# Patient Record
Sex: Female | Born: 1965
Health system: Southern US, Community
[De-identification: ages and names within clinical notes are randomized; demographics above are authoritative.]

## PROBLEM LIST (undated history)

## (undated) DIAGNOSIS — I959 Hypotension, unspecified: Secondary | ICD-10-CM

## (undated) DIAGNOSIS — N920 Excessive and frequent menstruation with regular cycle: Secondary | ICD-10-CM

## (undated) DIAGNOSIS — M797 Fibromyalgia: Secondary | ICD-10-CM

## (undated) DIAGNOSIS — E7800 Pure hypercholesterolemia, unspecified: Secondary | ICD-10-CM

## (undated) DIAGNOSIS — M25512 Pain in left shoulder: Secondary | ICD-10-CM

## (undated) DIAGNOSIS — N83209 Unspecified ovarian cyst, unspecified side: Secondary | ICD-10-CM

## (undated) DIAGNOSIS — K219 Gastro-esophageal reflux disease without esophagitis: Secondary | ICD-10-CM

## (undated) DIAGNOSIS — B001 Herpesviral vesicular dermatitis: Secondary | ICD-10-CM

## (undated) DIAGNOSIS — M19079 Primary osteoarthritis, unspecified ankle and foot: Secondary | ICD-10-CM

## (undated) DIAGNOSIS — M545 Low back pain, unspecified: Secondary | ICD-10-CM

## (undated) DIAGNOSIS — D509 Iron deficiency anemia, unspecified: Secondary | ICD-10-CM

## (undated) HISTORY — PX: WRIST GANGLION EXCISION: SUR520

## (undated) HISTORY — DX: Low back pain, unspecified: M54.50

## (undated) HISTORY — DX: Excessive and frequent menstruation with regular cycle: N92.0

## (undated) HISTORY — DX: Primary osteoarthritis, unspecified ankle and foot: M19.079

## (undated) HISTORY — PX: OTHER SURGICAL HISTORY: SHX169

## (undated) HISTORY — DX: Low back pain: M54.5

## (undated) HISTORY — DX: Pure hypercholesterolemia, unspecified: E78.00

## (undated) HISTORY — PX: APPENDECTOMY: SHX54

## (undated) HISTORY — DX: Iron deficiency anemia, unspecified: D50.9

## (undated) HISTORY — DX: Unspecified ovarian cyst, unspecified side: N83.209

## (undated) HISTORY — DX: Herpesviral vesicular dermatitis: B00.1

## (undated) HISTORY — DX: Hypotension, unspecified: I95.9

## (undated) HISTORY — PX: OVARIAN CYST REMOVAL: SHX89

---

## 2002-08-20 ENCOUNTER — Ambulatory Visit (HOSPITAL_COMMUNITY): Admission: RE | Admit: 2002-08-20 | Discharge: 2002-08-20 | Payer: Self-pay | Admitting: *Deleted

## 2002-08-20 ENCOUNTER — Encounter: Payer: Self-pay | Admitting: *Deleted

## 2002-12-28 ENCOUNTER — Other Ambulatory Visit: Admission: RE | Admit: 2002-12-28 | Discharge: 2002-12-28 | Payer: Self-pay | Admitting: Gynecology

## 2003-09-22 ENCOUNTER — Encounter: Payer: Self-pay | Admitting: Gynecology

## 2003-09-22 ENCOUNTER — Ambulatory Visit (HOSPITAL_COMMUNITY): Admission: RE | Admit: 2003-09-22 | Discharge: 2003-09-22 | Payer: Self-pay | Admitting: Gynecology

## 2005-03-16 ENCOUNTER — Other Ambulatory Visit: Admission: RE | Admit: 2005-03-16 | Discharge: 2005-03-16 | Payer: Self-pay | Admitting: Gynecology

## 2008-01-23 ENCOUNTER — Other Ambulatory Visit: Admission: RE | Admit: 2008-01-23 | Discharge: 2008-01-23 | Payer: Self-pay | Admitting: Gynecology

## 2009-01-25 ENCOUNTER — Ambulatory Visit: Payer: Self-pay | Admitting: Sports Medicine

## 2009-01-25 DIAGNOSIS — M76899 Other specified enthesopathies of unspecified lower limb, excluding foot: Secondary | ICD-10-CM | POA: Insufficient documentation

## 2009-01-25 DIAGNOSIS — IMO0002 Reserved for concepts with insufficient information to code with codable children: Secondary | ICD-10-CM | POA: Insufficient documentation

## 2009-04-06 ENCOUNTER — Encounter: Admission: RE | Admit: 2009-04-06 | Discharge: 2009-04-06 | Payer: Self-pay | Admitting: Family Medicine

## 2009-11-06 ENCOUNTER — Ambulatory Visit (HOSPITAL_COMMUNITY): Admission: RE | Admit: 2009-11-06 | Discharge: 2009-11-06 | Payer: Self-pay | Admitting: Unknown Physician Specialty

## 2010-01-20 ENCOUNTER — Ambulatory Visit: Payer: Self-pay | Admitting: Gynecology

## 2010-01-20 ENCOUNTER — Other Ambulatory Visit: Admission: RE | Admit: 2010-01-20 | Discharge: 2010-01-20 | Payer: Self-pay | Admitting: Gynecology

## 2010-02-06 ENCOUNTER — Ambulatory Visit: Payer: Self-pay | Admitting: Gynecology

## 2010-05-23 ENCOUNTER — Encounter: Admission: RE | Admit: 2010-05-23 | Discharge: 2010-05-23 | Payer: Self-pay | Admitting: Sports Medicine

## 2011-05-01 ENCOUNTER — Telehealth: Payer: Self-pay | Admitting: Pediatrics

## 2011-05-01 NOTE — Telephone Encounter (Signed)
error 

## 2011-08-30 ENCOUNTER — Ambulatory Visit (INDEPENDENT_AMBULATORY_CARE_PROVIDER_SITE_OTHER): Payer: Commercial Managed Care - PPO | Admitting: Pediatrics

## 2011-08-30 DIAGNOSIS — Z23 Encounter for immunization: Secondary | ICD-10-CM

## 2012-04-28 ENCOUNTER — Ambulatory Visit (INDEPENDENT_AMBULATORY_CARE_PROVIDER_SITE_OTHER): Payer: Commercial Managed Care - PPO | Admitting: Family Medicine

## 2012-04-28 ENCOUNTER — Ambulatory Visit: Payer: Commercial Managed Care - PPO

## 2012-04-28 VITALS — BP 107/67 | HR 76 | Temp 98.8°F | Resp 16 | Ht 65.0 in | Wt 165.0 lb

## 2012-04-28 DIAGNOSIS — M7740 Metatarsalgia, unspecified foot: Secondary | ICD-10-CM

## 2012-04-28 DIAGNOSIS — M7660 Achilles tendinitis, unspecified leg: Secondary | ICD-10-CM

## 2012-04-28 DIAGNOSIS — M7662 Achilles tendinitis, left leg: Secondary | ICD-10-CM

## 2012-04-28 DIAGNOSIS — M79609 Pain in unspecified limb: Secondary | ICD-10-CM

## 2012-04-28 DIAGNOSIS — M19079 Primary osteoarthritis, unspecified ankle and foot: Secondary | ICD-10-CM | POA: Insufficient documentation

## 2012-04-28 DIAGNOSIS — M79674 Pain in right toe(s): Secondary | ICD-10-CM

## 2012-04-28 DIAGNOSIS — M7750 Other enthesopathy of unspecified foot: Secondary | ICD-10-CM

## 2012-04-28 DIAGNOSIS — G5760 Lesion of plantar nerve, unspecified lower limb: Secondary | ICD-10-CM

## 2012-04-28 DIAGNOSIS — M7752 Other enthesopathy of left foot: Secondary | ICD-10-CM | POA: Insufficient documentation

## 2012-04-28 NOTE — Progress Notes (Signed)
  Subjective:    Patient ID: Sharon Simpson, female    DOB: 12/02/1966, 46 y.o.   MRN: [id number]  HPI R great toe:  Present for the last year.  Intermittent in nature.  Pain sometimes with weight bearing.  Shooting pain down 1st metarsal.  No known alleviating/aggravating factors.  No swelling.  No trauma.  Pain has acutely spiked over the last 1-2 weeks.  Pain minimally improved with NSAIDs.    L achilles pain: Pt was at championship game.  Son scored goal.  Jumped up an noted severe L achilles pain after words.  Pain worse with plantar flexion.  No swelling.  Point tenderness over insertion point of achilles to calcaneus.  This has never happened before.   Review of Systems See HPI, otherwise ROS negative.     Objective:   Physical Exam Gen: in bed, NAD MSK:  Ankle (L): No visible erythema or swelling. Range of motion is full in all directions. Strength is 5/5 in all directions. Stable lateral and medial ligaments; squeeze test and kleiger test unremarkable; Talar dome nontender; No pain at base of 5th MT; No tenderness over cuboid; No tenderness over N spot or navicular prominence No tenderness on posterior aspects of lateral and medial malleolus No sign of peroneal tendon subluxations; Negative tarsal tunnel tinel's Able to walk 4 steps. +TTP over insertion point of L achilles to calcaneus. + Pain with plantar flexion.   Foot (R): + TTP over ray of 1st MTP (on dorsal aspect0 No obvious swelling or erythema.  ROM intact       Assessment & Plan:

## 2012-04-28 NOTE — Assessment & Plan Note (Addendum)
Most consistent with L achilles enthesiopathy given mechanism of injury and point tenderness. Will place in post op shoe in interim. Discussed RICE treatment. Handout given.

## 2012-04-28 NOTE — Patient Instructions (Signed)
Morton's Neuroma Neuralgia (nerve pain) or neuroma (benign [non-cancerous] nerve tumor) may develop on any interdigital nerve. The interdigital nerves (nerves between digits) of the foot travel beneath and between the metatarsals (long bones of the fore foot) and pass the nerve endings to the toes. The third interdigital is a common place for a small neuroma to form called Morton's neuroma. Another nerve to be affected commonly is the fourth interdigital nerve. This would be in approximately in the area of the base or ball under the bottom of your fourth toe. This condition occurs more commonly in women and is usually on one side. It is usually first noticed by pain radiating (spreading) to the ball of the foot or to the toes. CAUSES The cause of interdigital neuralgia may be from low grade repetitive trauma (damage caused by an accident) as in activities causing a repeated pounding of the foot (running, jumping etc.). It is also caused by improper footwear or recent loss of the fatty padding on the bottom of the foot. TREATMENT  The condition often resolves (goes away) simply with decreasing activity if that is thought to be the cause. Proper shoes are beneficial. Orthotics (special foot support aids) such as a metatarsal bar are often beneficial. This condition usually responds to conservative therapy, however if surgery is necessary it usually brings complete relief. HOME CARE INSTRUCTIONS   Apply ice to the area of soreness for 15 to 20 minutes, 3 to 4 times per day, while awake for the first 2 days. Put ice in a plastic bag and place a towel between the bag of ice and your skin.   Only take over-the-counter or prescription medicines for pain, discomfort, or fever as directed by your caregiver.  MAKE SURE YOU:   Understand these instructions.   Will watch your condition.   Will get help right away if you are not doing well or get worse.  Document Released: 03/04/2001 Document Revised:  11/15/2011 Document Reviewed: 11/26/2005 [facility] Patient Information 2012 [place], [place].  Patient information: Metatarsalgia   (The Basics)View in SpanishWritten by the doctors and editors at UpToDate  What is metatarsalgia? -- Metatarsalgia is a condition that causes pain in the ball of the foot. It happens when there is inflammation in the metatarsals, which are the foot bones closest to the toes (figure 1). Different things can cause metatarsalgia. It can happen in people who run or do other activities that put a lot of pressure on the feet. It can also happen in people who wear tight-fitting shoes a lot or have certain foot problems. What are the symptoms of metatarsalgia? -- Metatarsalgia causes pain in the ball of the foot. The pain can also spread to the toes. The pain is usually worse when people run or do other activities that put pressure on their feet. Will I need tests? -- Probably not. Your doctor or nurse should be able to tell if you have metatarsalgia by learning about your symptoms and doing an exam. He or she might order an X-ray of your foot to make sure your symptoms are not caused by another condition. How is metatarsalgia treated? -- Treatment for metatarsalgia usually involves 1 or more of the following: Resting your foot - Give your foot a chance to heal by resting. But don't completely stop being active. You can do activities that put less pressure on your feet, such as swimming.  Putting ice on your foot when it hurts or after activities that cause pain - You can  put a cold gel pack, bag of ice, or bag of frozen vegetables on the painful area every 1 to 2 hours, for 15 minutes each time. Put a thin towel between the ice (or other cold object) and your skin.  Taking a pain-relieving medicine, such as acetaminophen (sample brand name: Tylenol), ibuprofen (sample brand names: Advil, Motrin), or naproxen (sample brand names: Aleve, Naprosyn).  Wearing sturdy shoes and a  metatarsal insert - Sneakers with a lot of cushion and good arch and heel support are best. Your doctor will also probably recommend that you put a padded insert called a "metatarsal pad" into your shoe.  Wearing arch supports or special shoe inserts called "orthotics" that are made to fit your foot In most cases, these treatments help improve symptoms. But if your symptoms don't improve, your doctor might talk with you about other options. This might involve surgery to correct the position of your foot bones. How long does metatarsalgia take to heal? -- Metatarsalgia can take weeks to months to heal, depending on the cause and your symptoms.   Achilles Tendinitis Tendinitis a swelling and soreness of the tendon. The pain in the tendon (cord-like structure which attaches muscle to bone) is produced by tiny tears and the inflammation present in that tendon. It commonly occurs at the shoulders, heels, and elbows. It is usually caused by overusing the tendon and joint involved. Achilles tendinitis involves the Achilles tendon. This is the large tendon in the back of the leg just above the foot. It attaches the large muscles of the lower leg to the heel bone (called calcaneus).  This diagnosis (learning what is wrong) is made by examination. X-rays will be generally be normal if only tendinitis is present. HOME CARE INSTRUCTIONS   Apply ice to the injury for 15 to 20 minutes, 3 to 4 times per day. Put the ice in a plastic bag and place a towel between the bag of ice and your skin.   Try to avoid use other than gentle range of motion while the tendon is painful. Do not resume use until instructed by your caregiver. Then begin use gradually. Do not increase use to the point of pain. If pain does develop, decrease use and continue the above measures. Gradually increase activities that do not cause discomfort until you gradually achieve normal use.   Only take over-the-counter or prescription medicines for  pain, discomfort, or fever as directed by your caregiver.  SEEK MEDICAL CARE IF:   Your pain and swelling increase or pain is uncontrolled with medications.   You develop new, unexplained problems (symptoms) or an increase of the symptoms that brought you to your caregiver.   You develop an inability to move your toes or foot, develop warmth and swelling in your foot, or begin running an unexplained temperature.  MAKE SURE YOU:   Understand these instructions.   Will watch your condition.   Will get help right away if you are not doing well or get worse.  Document Released: 09/05/2005 Document Revised: 11/15/2011 Document Reviewed: 07/14/2008 [facility] Patient Information 2012 [place], [place].

## 2012-04-28 NOTE — Assessment & Plan Note (Signed)
Ddx includes metatarsalgia, 1st mtp arthritis, and mortons neuroma. . Noted 1st mtp arthritis on foot xray.  Will place in metatarsal pad. NSAIDs. 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 Will defer formal referral because of this.

## 2012-04-30 ENCOUNTER — Ambulatory Visit (INDEPENDENT_AMBULATORY_CARE_PROVIDER_SITE_OTHER): Payer: 59 | Admitting: Sports Medicine

## 2012-04-30 VITALS — BP 100/60 | Ht 65.0 in | Wt 164.0 lb

## 2012-04-30 DIAGNOSIS — M7752 Other enthesopathy of left foot: Secondary | ICD-10-CM

## 2012-04-30 DIAGNOSIS — M79674 Pain in right toe(s): Secondary | ICD-10-CM

## 2012-04-30 DIAGNOSIS — M79609 Pain in unspecified limb: Secondary | ICD-10-CM

## 2012-04-30 DIAGNOSIS — M7750 Other enthesopathy of unspecified foot: Secondary | ICD-10-CM

## 2012-04-30 MED ORDER — MELOXICAM 15 MG PO TABS: ORAL_TABLET | ORAL | Status: AC

## 2012-04-30 NOTE — Progress Notes (Signed)
  Subjective:    Patient ID: Sharon Simpson, female    DOB: 1966-06-10, 46 y.o.   MRN: [id number]  HPI Sharon Simpson is a very pleasant 46 year old female who comes in with 2 complaints.  Right great toe pain: Present for approximately 2 years. Went to urgent care, had a first metatarsal ray posting placed, that helped her pain significantly. She did have x-rays at that point which showed significant first metatarsophalangeal Dorsal spurring.  Left heel pain. Localized over the calcaneal bursa. Was placed in a postop shoe from urgent care, and returns for further evaluation.  Past medical history, surgical history, family history, social history, allergies, and medications reviewed from the medical record and no changes needed. Review of Systems    No fevers, chills, night sweats, weight loss, chest pain, or shortness of breath.  Social History: Non-smoker. Objective:   Physical Exam General:  Well developed, well nourished, and in no acute distress. Neuro:  Alert and oriented x3, extra-ocular muscles intact. Skin: Warm and dry, no rashes noted. Respiratory:  Not using accessory muscles, speaking in full sentences. Musculoskeletal: Right foot: Hallux rigidus present, pain all over the first metatarsophalangeal joint.  Left foot: Tender to palpation over calcaneal bursa, some fullness palpable in this area. Negative Thompson's test, and no nodules palpable in the Achilles, no tenderness over the retrocalcaneal bursa. Negative calcaneal squeeze, no tenderness to palpation at the calcaneal insertion of the plantar fascia.      Assessment & Plan:

## 2012-04-30 NOTE — Assessment & Plan Note (Signed)
Meloxicam. 1st MT ray post. RTC June 18 if no better for injection.

## 2012-04-30 NOTE — Assessment & Plan Note (Signed)
Meloxicam. Heel lift. RTC June 18 if no better to consider injection.

## 2012-05-01 NOTE — Progress Notes (Signed)
  Subjective:    Patient ID: Sharon Simpson, female    DOB: [birth year], 46 y.o.   MRN: [id number]  HPI    Review of Systems     Objective:   Physical Exam        Assessment & Plan:  Patient precepted with Dr. Alvester Morin.  AGree with assessment and plan.

## 2014-02-08 ENCOUNTER — Other Ambulatory Visit (HOSPITAL_COMMUNITY)
Admission: RE | Admit: 2014-02-08 | Discharge: 2014-02-08 | Disposition: A | Discharge status: Home / Self Care | Payer: 59 | Source: Ambulatory Visit | Attending: Gynecology | Admitting: Gynecology

## 2014-02-08 ENCOUNTER — Ambulatory Visit (INDEPENDENT_AMBULATORY_CARE_PROVIDER_SITE_OTHER): Payer: 59 | Admitting: Gynecology

## 2014-02-08 ENCOUNTER — Encounter: Payer: Self-pay | Admitting: Gynecology

## 2014-02-08 VITALS — BP 128/80

## 2014-02-08 DIAGNOSIS — N925 Other specified irregular menstruation: Secondary | ICD-10-CM

## 2014-02-08 DIAGNOSIS — Z1151 Encounter for screening for human papillomavirus (HPV): Secondary | ICD-10-CM | POA: Insufficient documentation

## 2014-02-08 DIAGNOSIS — R635 Abnormal weight gain: Secondary | ICD-10-CM

## 2014-02-08 DIAGNOSIS — Z01419 Encounter for gynecological examination (general) (routine) without abnormal findings: Secondary | ICD-10-CM | POA: Insufficient documentation

## 2014-02-08 DIAGNOSIS — R42 Dizziness and giddiness: Secondary | ICD-10-CM

## 2014-02-08 DIAGNOSIS — Z124 Encounter for screening for malignant neoplasm of cervix: Secondary | ICD-10-CM

## 2014-02-08 DIAGNOSIS — N938 Other specified abnormal uterine and vaginal bleeding: Secondary | ICD-10-CM

## 2014-02-08 DIAGNOSIS — N949 Unspecified condition associated with female genital organs and menstrual cycle: Secondary | ICD-10-CM

## 2014-02-08 LAB — CBC WITH DIFFERENTIAL/PLATELET
BASOS ABS: 0.1 10*3/uL (ref 0.0–0.1)
Basophils Relative: 1 % (ref 0–1)
Eosinophils Absolute: 0.1 10*3/uL (ref 0.0–0.7)
Eosinophils Relative: 2 % (ref 0–5)
HCT: 31.2 % — ABNORMAL LOW (ref 36.0–46.0)
Hemoglobin: 10 g/dL — ABNORMAL LOW (ref 12.0–15.0)
LYMPHS ABS: 1.7 10*3/uL (ref 0.7–4.0)
LYMPHS PCT: 27 % (ref 12–46)
MCH: 24.9 pg — ABNORMAL LOW (ref 26.0–34.0)
MCHC: 32.1 g/dL (ref 30.0–36.0)
MCV: 77.8 fL — ABNORMAL LOW (ref 78.0–100.0)
Monocytes Absolute: 0.4 10*3/uL (ref 0.1–1.0)
Monocytes Relative: 7 % (ref 3–12)
NEUTROS ABS: 4 10*3/uL (ref 1.7–7.7)
Neutrophils Relative %: 63 % (ref 43–77)
PLATELETS: 344 10*3/uL (ref 150–400)
RBC: 4.01 MIL/uL (ref 3.87–5.11)
RDW: 15.1 % (ref 11.5–15.5)
WBC: 6.4 10*3/uL (ref 4.0–10.5)

## 2014-02-08 LAB — GLUCOSE, RANDOM: GLUCOSE: 93 mg/dL (ref 70–99)

## 2014-02-08 LAB — TSH: TSH: 4.35 u[IU]/mL (ref 0.350–4.500)

## 2014-02-08 MED ORDER — MEGESTROL ACETATE 40 MG PO TABS: 40.0000 mg | ORAL_TABLET | Freq: Two times a day (BID) | ORAL | Status: DC

## 2014-02-08 NOTE — Progress Notes (Signed)
   48 year old who presents to the office today stating she has had continuous vaginal bleeding for the past 2 weeks. Patient has not been seen in the office is 2011. Her husband has had a vasectomy. She is also stated the past few days at times that she had felt lightheadedness. She had a menstrual period January 18 and lasted for 5 days She started her next cycle February 18 and has been bleeding since then. Patient also has complained of some weight gain.  Exam: Pelvic exam: Bartholin urethra Skene glands within normal limits blood was noted on the patient's medial thighs Vagina: Blood was present in the vaginal vault Cervix: Blood was present on the external cervical loss Uterus: Upper limits of normal retroverted nontender Adnexa: No palpable mass or tenderness Rectal exam: Not done  A Pap smear was done today. Patient was counseled for an endometrial biopsy. The cervix was cleansed with Betadine solution and a sterile Pipelle was introduced into the uterine cavity and tissue was obtained was submitted for histological evaluation.  Assessment/plan: Patient perimenopausal with dysfunctional uterine bleeding moderate amount of tissue was obtained at time of biopsy submitted for histological evaluation. We will have the following labs ordered today: TSH, prolactin, FSH, and CBC. Her Pap smear was done today as well. Patient will be started on Megace 40 mg 1 by mouth twice a day for 10 days to stop her bleeding. She will be scheduled for sonohysterogram next week to rule out any intrauterine cavity defects such as polyps or myomas.

## 2014-02-08 NOTE — Patient Instructions (Addendum)
Transvaginal Ultrasound Transvaginal ultrasound is a pelvic ultrasound, using a metal probe that is placed in the vagina, to look at a women's female organs. Transvaginal ultrasound is a method of seeing inside the pelvis of a woman. The ultrasound machine sends out sound waves from the transducer (probe). These sound waves bounce off body structures (like an echo) to create a picture. The picture shows up on a monitor. It is called transvaginal because the probe is inserted into the vagina. There should be very little discomfort from the vaginal probe. This test can also be used during pregnancy. Endovaginal ultrasound is another name for a transvaginal ultrasound. In a transabdominal ultrasound, the probe is placed on the outside of the belly. This method gives pictures that are lower quality than pictures from the transvaginal technique. Transvaginal ultrasound is used to look for problems of the female genital tract. Some such problems include:  Infertility problems.  Congenital (birth defect) malformations of the uterus and ovaries.  Tumors in the uterus.  Abnormal bleeding.  Ovarian tumors and cysts.  Abscess (inflamed tissue around pus) in the pelvis.  Unexplained abdominal or pelvic pain.  Pelvic infection. DURING PREGNANCY, TRANSVAGINAL ULTRASOUND MAY BE USED TO LOOK AT:  Normal pregnancy.  Ectopic pregnancy (pregnancy outside the uterus).  Fetal heartbeat.  Abnormalities in the pelvis, that are not seen well with transabdominal ultrasound.  Suspected twins or multiples.  Impending miscarriage.  Problems with the cervix (incompetent cervix, not able to stay closed and hold the baby).  When doing an amniocentesis (removing fluid from the pregnancy sac, for testing).  Looking for abnormalities of the baby.  Checking the growth, development, and age of the fetus.  Measuring the amount of fluid in the amniotic sac.  When doing an external version of the baby (moving  baby into correct position).  Evaluating the baby for problems in high risk pregnancies (biophysical profile).  Suspected fetal demise (death). Sometimes a special ultrasound method called Saline Infusion Sonography (SIS) is used for a more accurate look at the uterus. Sterile saline (salt water) is injected into the uterus of non-pregnant patients to see the inside of the uterus better. SIS is not used on pregnant women. The vaginal probe can also assist in obtaining biopsies of abnormal areas, in draining fluid from cysts on the ovary, and in finding IUDs (intrauterine device, birth control) that cannot be located. PREPARATION FOR TEST A transvaginal ultrasound is done with the bladder empty. The transabdominal ultrasound is done with your bladder full. You may be asked to drink several glasses of water before that exam. Sometimes, a transabdominal ultrasound is done just after a transvaginal ultrasound, to look at organs in your abdomen. PROCEDURE  You will lie down on a table, with your knees bent and your feet in foot holders. The probe is covered with a condom. A sterile lubricant is put into the vagina and on the probe. The lubricant helps transmit the sound waves and avoid irritating the vagina. Your caregiver will move the probe inside the vaginal cavity to scan the pelvic structures. A normal test will show a normal pelvis and normal contents. An abnormal test will show abnormalities of the pelvis, placenta, or baby. ABNORMAL RESULTS MAY BE DUE TO:  Growths or tumors in the:  Uterus.  Ovaries.  Vagina.  Other pelvic structures.  Non-cancerous growths of the uterus and ovaries.  Twisting of the ovary, cutting off blood supply to the ovary (ovarian torsion).  Areas of infection, including:  Pelvic  inflammatory disease.  Abscess in the pelvis.  Locating an IUD. PROBLEMS FOUND IN PREGNANT WOMEN MAY INCLUDE:  Ectopic pregnancy (pregnancy outside the uterus).  Multiple  pregnancies.  Early dilation (opening) of the cervix. This may indicate an incompetent cervix and early delivery.  Impending miscarriage.  Fetal death.  Problems with the placenta, including:  Placenta has grown over the opening of the womb (placenta previa).  Placenta has separated early in the womb (placental abruption).  Placenta grows into the muscle of the uterus (placenta accreta).  Tumors of pregnancy, including gestational trophoblastic disease. This is an abnormal pregnancy, with no fetus. The uterus is filled with many grape-like cysts that could sometimes be cancerous.  Incorrect position of the fetus (breech, vertex).  Intrauterine fetal growth retardation (IUGR) (poor growth in the womb).  Fetal abnormalities or infection. RISKS AND COMPLICATIONS There are no known risks to the ultrasound procedure. There is no X-ray used when doing an ultrasound. Document Released: 11/07/2004 Document Revised: 02/18/2012 Document Reviewed: 10/26/2009 [facility] Patient Information 2014 [place], [place]. Endometrial Biopsy Endometrial biopsy is a procedure in which a tissue sample is taken from inside the uterus. The tissue sample is then looked at under a microscope to see if the tissue is normal or abnormal. The endometrium is the lining of the uterus. This procedure helps determine where you are in your menstrual cycle and how hormone levels are affecting the lining of the uterus. This procedure may also be used to evaluate uterine bleeding or to diagnose endometrial cancer, tuberculosis, polyps, or inflammatory conditions.  LET [facility] CARE PROVIDER KNOW ABOUT: Any allergies you have. All medicines you are taking, including vitamins, herbs, eye drops, creams, and over-the-counter medicines. Previous problems you or members of your family have had with the use of anesthetics. Any blood disorders you have. Previous surgeries you have had. Medical conditions you have. Possibility of  pregnancy. RISKS AND COMPLICATIONS Generally, this is a safe procedure. However, as with any procedure, complications can occur. Possible complications include: Bleeding. Pelvic infection. Puncture of the uterine wall with the biopsy device (rare). BEFORE THE PROCEDURE  Keep a record of your menstrual cycles as directed by your health care provider. You may need to schedule your procedure for a specific time in your cycle. You may want to bring a sanitary pad to wear home after the procedure. Arrange for someone to drive you home after the procedure if you will be given a medicine to help you relax (sedative). PROCEDURE  You may be given a sedative to relax you. You will lie on an exam table with your feet and legs supported as in a pelvic exam. Your health care provider will insert an instrument (speculum) into your vagina to see your cervix. Your cervix will be cleansed with an antiseptic solution. A medicine (local anesthetic) will be used to numb the cervix. A forceps instrument (tenaculum) will be used to hold your cervix steady for the biopsy. A thin, rodlike instrument (uterine sound) will be inserted through your cervix to determine the length of your uterus and the location where the biopsy sample will be removed. A thin, flexible tube (catheter) will be inserted through your cervix and into the uterus. The catheter is used to collect the biopsy sample from your endometrial tissue. The catheter and speculum will then be removed, and the tissue sample will be sent to a lab for examination. AFTER THE PROCEDURE You will rest in a recovery area until you are ready to go home.  You may have mild cramping and a small amount of vaginal bleeding for a few days after the procedure. This is normal. Make sure you find out how to get your test results. Document Released: 03/29/2005 Document Revised: 07/29/2013 Document Reviewed: 05/13/2013 [facility] Patient Information 2014 [place], [place].

## 2014-02-09 LAB — FOLLICLE STIMULATING HORMONE: FSH: 14.9 m[IU]/mL

## 2014-02-09 LAB — PROLACTIN: PROLACTIN: 5.7 ng/mL

## 2014-02-15 ENCOUNTER — Other Ambulatory Visit: Payer: Self-pay | Admitting: Gynecology

## 2014-02-15 DIAGNOSIS — N938 Other specified abnormal uterine and vaginal bleeding: Secondary | ICD-10-CM

## 2014-02-15 DIAGNOSIS — N924 Excessive bleeding in the premenopausal period: Secondary | ICD-10-CM

## 2014-02-15 DIAGNOSIS — N920 Excessive and frequent menstruation with regular cycle: Secondary | ICD-10-CM

## 2014-02-19 ENCOUNTER — Ambulatory Visit: Payer: 59 | Admitting: Gynecology

## 2014-02-19 ENCOUNTER — Other Ambulatory Visit: Payer: 59

## 2014-02-19 ENCOUNTER — Ambulatory Visit (INDEPENDENT_AMBULATORY_CARE_PROVIDER_SITE_OTHER): Payer: 59

## 2014-02-19 ENCOUNTER — Ambulatory Visit (INDEPENDENT_AMBULATORY_CARE_PROVIDER_SITE_OTHER): Payer: 59 | Admitting: Gynecology

## 2014-02-19 ENCOUNTER — Other Ambulatory Visit: Payer: Self-pay | Admitting: Gynecology

## 2014-02-19 DIAGNOSIS — N924 Excessive bleeding in the premenopausal period: Secondary | ICD-10-CM

## 2014-02-19 DIAGNOSIS — D251 Intramural leiomyoma of uterus: Secondary | ICD-10-CM

## 2014-02-19 DIAGNOSIS — D649 Anemia, unspecified: Secondary | ICD-10-CM

## 2014-02-19 DIAGNOSIS — N938 Other specified abnormal uterine and vaginal bleeding: Secondary | ICD-10-CM

## 2014-02-19 DIAGNOSIS — N83209 Unspecified ovarian cyst, unspecified side: Secondary | ICD-10-CM

## 2014-02-19 DIAGNOSIS — N840 Polyp of corpus uteri: Secondary | ICD-10-CM

## 2014-02-19 DIAGNOSIS — N949 Unspecified condition associated with female genital organs and menstrual cycle: Secondary | ICD-10-CM

## 2014-02-19 DIAGNOSIS — N920 Excessive and frequent menstruation with regular cycle: Secondary | ICD-10-CM

## 2014-02-19 DIAGNOSIS — D252 Subserosal leiomyoma of uterus: Secondary | ICD-10-CM

## 2014-02-19 DIAGNOSIS — D259 Leiomyoma of uterus, unspecified: Secondary | ICD-10-CM

## 2014-02-19 DIAGNOSIS — N852 Hypertrophy of uterus: Secondary | ICD-10-CM

## 2014-02-19 DIAGNOSIS — N925 Other specified irregular menstruation: Secondary | ICD-10-CM

## 2014-02-19 NOTE — Progress Notes (Signed)
   The patient presented to the office today for her ongoing evaluation for dysfunctional uterine bleeding. Patient was seen in the office on 02/08/2014 please see note for detail. At that office visit patient had an endometrial biopsy with the following results:  Endometrium, biopsy, uterus - PROLIFERATIVE-TYPE ENDOMETRIUM WITH BREAKDOWN  The patient's lab work indicated that her hemoglobin was 10.0 with a platelet count 31.2 and platelet count 344,000 A. she was started on iron supplementation 1 by mouth twice a day. She was also started on Megace 40 mg twice a day for 10 days to stop her bleeding. Her FSH, prolactin, TSH and blood sugar were all normal as was her Pap smear.  Patient's here today for a sonohysterogram: Uterus measuring 9.8 x 0.0 x 5.6 cm with endometrial stripe of 20.7 mm. Patient had a subserous myoma measuring 7.0 x 5.1 x 6.5 cm with a feeder vessel seen from the uterus. Right ovary thinwall ankle free vascular cyst measuring 31 x 22 x 24 mm. Lipitor was normal. No fluid in the cul-de-sac. After the cervix was cleansed with Betadine solution a sterile intrauterine catheter was introduced into the uterine cavity whereby normal saline was instilled and several endometrial polyps were noted the largest one measuring 23 x 20 x 18 mm.  Assessment/plan: #1 dysfunctional uterine bleeding #2 endometrial polyps #3 small right ovarian cyst #4 anemia  Patient will be scheduled for outpatient resectoscopic polypectomy. She will see me in the office the week before surgery. Literature information was provided. We'll monitor her fibroid in small cyst in the near future. Patient will continue on her iron tablet one twice a day.

## 2014-02-23 ENCOUNTER — Telehealth: Payer: Self-pay

## 2014-02-23 NOTE — Telephone Encounter (Signed)
I contacted patient with surgery date 03/19/14 7:30am and informed her. She confirmed this date will be fine with her. We scheduled her pre op consult for 03/09/14.

## 2014-03-04 ENCOUNTER — Encounter (HOSPITAL_COMMUNITY): Payer: Self-pay | Admitting: *Deleted

## 2014-03-09 ENCOUNTER — Ambulatory Visit: Payer: 59 | Admitting: Gynecology

## 2014-03-17 ENCOUNTER — Encounter: Payer: Self-pay | Admitting: Gynecology

## 2014-03-17 ENCOUNTER — Ambulatory Visit (INDEPENDENT_AMBULATORY_CARE_PROVIDER_SITE_OTHER): Payer: 59 | Admitting: Gynecology

## 2014-03-17 VITALS — BP 124/76

## 2014-03-17 DIAGNOSIS — Z01818 Encounter for other preprocedural examination: Secondary | ICD-10-CM

## 2014-03-17 DIAGNOSIS — N840 Polyp of corpus uteri: Secondary | ICD-10-CM

## 2014-03-17 DIAGNOSIS — N949 Unspecified condition associated with female genital organs and menstrual cycle: Secondary | ICD-10-CM

## 2014-03-17 DIAGNOSIS — N938 Other specified abnormal uterine and vaginal bleeding: Secondary | ICD-10-CM

## 2014-03-17 MED ORDER — METOCLOPRAMIDE HCL 10 MG PO TABS: 10.0000 mg | ORAL_TABLET | Freq: Three times a day (TID) | ORAL | Status: DC

## 2014-03-17 NOTE — Progress Notes (Signed)
Sharon Simpson is an 48 y.o. female. For preop exam. Patient scheduled for resectoscopic polypectomy at the end of this week as a result of her dysfunctional uterine bleeding and endometrial polyps. Her workup has consisted of the following:  Endometrium, biopsy, uterus  - PROLIFERATIVE-TYPE ENDOMETRIUM WITH BREAKDOWN  The patient's lab work indicated that her hemoglobin was 10.0 with a hemoglobin of 31.2 and platelet count 344,000 . She was started on iron supplementation 1 by mouth twice a day. She was also started on Megace 40 mg twice a day for 10 days to stop her bleeding. Her FSH, prolactin, TSH and blood sugar were all normal as was her Pap smear.   sonohysterogram:  Uterus measuring 9.8 x 0.0 x 5.6 cm with endometrial stripe of 20.7 mm. Patient had a subserous myoma measuring 7.0 x 5.1 x 6.5 cm with a feeder vessel seen from the uterus. Right ovary thinwall avascular cyst measuring 31 x 22 x 24 mm. left ovary was normal. No fluid in the cul-de-sac. After the cervix was cleansed with Betadine solution a sterile intrauterine catheter was introduced into the uterine cavity whereby normal saline was instilled and several endometrial polyps were noted the largest one measuring 23 x 20 x 18 mm.  Pertinent Gynecological History: Menses: intermenstrual bleeding Bleeding: intermenstrual bleeding Contraception: vasectomy DES exposure: denies Blood transfusions: none Sexually transmitted diseases: none Previous GYN Procedures: laparotomy/right ovarian cystectomy/appendectomy/3 NSVD  Last mammogram: normal Date: 2011 Last pap: normal Date: 2015 OB History: G3  P3  Menstrual History: Menarche age: 45 No LMP recorded.    No past medical history on file.  Past Surgical History  Procedure Laterality Date  . Appendectomy      48 YR OLD  . Wrist ganglion excision      No family history on file.  Social History:  reports that she has quit smoking. She does not have any  smokeless tobacco history on file. She reports that she does not drink alcohol. Her drug history is not on file.  Allergies:  Allergies  Allergen Reactions  . Codeine   . Latex   . Tetracyclines & Related      (Not in a hospital admission)  REVIEW OF SYSTEMS: A ROS was performed and pertinent positives and negatives are included in the history.  GENERAL: No fevers or chills. HEENT: No change in vision, no earache, sore throat or sinus congestion. NECK: No pain or stiffness. CARDIOVASCULAR: No chest pain or pressure. No palpitations. PULMONARY: No shortness of breath, cough or wheeze. GASTROINTESTINAL: No abdominal pain, nausea, vomiting or diarrhea, melena or bright red blood per rectum. GENITOURINARY: No urinary frequency, urgency, hesitancy or dysuria. MUSCULOSKELETAL: No joint or muscle pain, no back pain, no recent trauma. DERMATOLOGIC: No rash, no itching, no lesions. ENDOCRINE: No polyuria, polydipsia, no heat or cold intolerance. No recent change in weight. HEMATOLOGICAL: No anemia or easy bruising or bleeding. NEUROLOGIC: No headache, seizures, numbness, tingling or weakness. PSYCHIATRIC: No depression, no loss of interest in normal activity or change in sleep pattern.     Blood pressure 124/76.  Physical Exam:  HEENT:unremarkable Neck:Supple, midline, no thyroid megaly, no carotid bruits Lungs:  Clear to auscultation no rhonchi's or wheezes Heart:Regular rate and rhythm, no murmurs or gallops Breast Exam: Not done Abdomen: Soft nontender no rebound or guarding Pelvic:BUS within normal limits Vagina: Some dark menstrual blood present Cervix: No lesions or discharge Uterus: Slightly retroverted Adnexa: No palpable masses or tenderness Extremities: No cords, no  edema Rectal: Not done  Assessment/plan:  #1 dysfunctional uterine bleeding  #2 endometrial polyps  #3 small right ovarian cyst  #4 anemia Patient scheduled for resectoscope polypectomy at the end of this week  the following risks were discussed:                        Patient was counseled as to the risk of surgery to include the following:  1. Infection (prohylactic antibiotics will be administered)  2. DVT/Pulmonary Embolism (prophylactic pneumo compression stockings will be used)  3.Trauma to internal organs requiring additional surgical procedure to repair any injury to     Internal organs requiring perhaps additional hospitalization days.  4.Hemmorhage requiring transfusion and blood products which carry risks such as   anaphylactic reaction, hepatitis and AIDS  Patient had received literature information on the procedure scheduled and all her questions were answered and fully accepts all risk.   Starlyn Skeans FernandezMD8:43 AMTD@Note : This dictation was prepared with  Dragon/digital dictation along withSmart phrase technology. Any transcriptional errors that result from this process are unintentional.      Terrance Mass 03/17/2014, 8:23 AM  Note: This dictation was prepared with  Dragon/digital dictation along withSmart phrase technology. Any transcriptional errors that result from this process are unintentional.

## 2014-03-17 NOTE — Patient Instructions (Signed)
Hysteroscopy °Hysteroscopy is a procedure used for looking inside the womb (uterus). It may be done for various reasons, including: °· To evaluate abnormal bleeding, fibroid (benign, noncancerous) tumors, polyps, scar tissue (adhesions), and possibly cancer of the uterus. °· To look for lumps (tumors) and other uterine growths. °· To look for causes of why a woman cannot get pregnant (infertility), causes of recurrent loss of pregnancy (miscarriages), or a lost intrauterine device (IUD). °· To perform a sterilization by blocking the fallopian tubes from inside the uterus. °In this procedure, a thin, flexible tube with a tiny light and camera on the end of it (hysteroscope) is used to look inside the uterus. A hysteroscopy should be done right after a menstrual period to be sure you are not pregnant. °LET YOUR HEALTH CARE PROVIDER KNOW ABOUT:  °· Any allergies you have. °· All medicines you are taking, including vitamins, herbs, eye drops, creams, and over-the-counter medicines. °· Previous problems you or members of your family have had with the use of anesthetics. °· Any blood disorders you have. °· Previous surgeries you have had. °· Medical conditions you have. °RISKS AND COMPLICATIONS  °Generally, this is a safe procedure. However, as with any procedure, complications can occur. Possible complications include: °· Putting a hole in the uterus. °· Excessive bleeding. °· Infection. °· Damage to the cervix. °· Injury to other organs. °· Allergic reaction to medicines. °· Too much fluid used in the uterus for the procedure. °BEFORE THE PROCEDURE  °· Ask your health care provider about changing or stopping any regular medicines. °· Do not take aspirin or blood thinners for 1 week before the procedure, or as directed by your health care provider. These can cause bleeding. °· If you smoke, do not smoke for 2 weeks before the procedure. °· In some cases, a medicine is placed in the cervix the day before the procedure.  This medicine makes the cervix have a larger opening (dilate). This makes it easier for the instrument to be inserted into the uterus during the procedure. °· Do not eat or drink anything for at least 8 hours before the surgery. °· Arrange for someone to take you home after the procedure. °PROCEDURE  °· You may be given a medicine to relax you (sedative). You may also be given one of the following: °· A medicine that numbs the area around the cervix (local anesthetic). °· A medicine that makes you sleep through the procedure (general anesthetic). °· The hysteroscope is inserted through the vagina into the uterus. The camera on the hysteroscope sends a picture to a TV screen. This gives the surgeon a good view inside the uterus. °· During the procedure, air or a liquid is put into the uterus, which allows the surgeon to see better. °· Sometimes, tissue is gently scraped from inside the uterus. These tissue samples are sent to a lab for testing. °AFTER THE PROCEDURE  °· If you had a general anesthetic, you may be groggy for a couple hours after the procedure. °· If you had a local anesthetic, you will be able to go home as soon as you are stable and feel ready. °· You may have some cramping. This normally lasts for a couple days. °· You may have bleeding, which varies from light spotting for a few days to menstrual-like bleeding for 3 7 days. This is normal. °· If your test results are not back during the visit, make an appointment with your health care provider to find out   the results. °Document Released: 03/04/2001 Document Revised: 09/16/2013 Document Reviewed: 06/25/2013 °[employer]® Patient Information ©2014 [employer]. ° °

## 2014-03-18 MED ORDER — DEXTROSE 5 % IV SOLN
2.0000 g | INTRAVENOUS | Status: AC
  Administered 2014-03-19: 2 g via INTRAVENOUS
  Filled 2014-03-18: qty 2

## 2014-03-19 ENCOUNTER — Encounter (HOSPITAL_COMMUNITY): Payer: 59 | Admitting: Anesthesiology

## 2014-03-19 ENCOUNTER — Ambulatory Visit (HOSPITAL_COMMUNITY)
Admission: RE | Admit: 2014-03-19 | Discharge: 2014-03-19 | Disposition: A | Discharge status: Home / Self Care | Payer: 59 | Source: Ambulatory Visit | Attending: Gynecology | Admitting: Gynecology

## 2014-03-19 ENCOUNTER — Encounter (HOSPITAL_COMMUNITY)
Admission: RE | Disposition: A | Discharge status: Home / Self Care | Payer: Self-pay | Source: Ambulatory Visit | Attending: Gynecology

## 2014-03-19 ENCOUNTER — Encounter (HOSPITAL_COMMUNITY): Payer: Self-pay | Admitting: Pharmacy Technician

## 2014-03-19 ENCOUNTER — Encounter (HOSPITAL_COMMUNITY): Payer: Self-pay | Admitting: *Deleted

## 2014-03-19 ENCOUNTER — Telehealth: Payer: Self-pay

## 2014-03-19 ENCOUNTER — Ambulatory Visit (HOSPITAL_COMMUNITY): Payer: 59 | Admitting: Anesthesiology

## 2014-03-19 DIAGNOSIS — N840 Polyp of corpus uteri: Secondary | ICD-10-CM | POA: Insufficient documentation

## 2014-03-19 DIAGNOSIS — D649 Anemia, unspecified: Secondary | ICD-10-CM

## 2014-03-19 DIAGNOSIS — Z9889 Other specified postprocedural states: Secondary | ICD-10-CM

## 2014-03-19 DIAGNOSIS — Z87891 Personal history of nicotine dependence: Secondary | ICD-10-CM | POA: Insufficient documentation

## 2014-03-19 DIAGNOSIS — Z885 Allergy status to narcotic agent status: Secondary | ICD-10-CM | POA: Insufficient documentation

## 2014-03-19 DIAGNOSIS — N949 Unspecified condition associated with female genital organs and menstrual cycle: Secondary | ICD-10-CM | POA: Insufficient documentation

## 2014-03-19 DIAGNOSIS — N83209 Unspecified ovarian cyst, unspecified side: Secondary | ICD-10-CM | POA: Insufficient documentation

## 2014-03-19 DIAGNOSIS — D252 Subserosal leiomyoma of uterus: Secondary | ICD-10-CM | POA: Insufficient documentation

## 2014-03-19 DIAGNOSIS — N938 Other specified abnormal uterine and vaginal bleeding: Secondary | ICD-10-CM | POA: Insufficient documentation

## 2014-03-19 DIAGNOSIS — N920 Excessive and frequent menstruation with regular cycle: Secondary | ICD-10-CM

## 2014-03-19 DIAGNOSIS — Z91040 Latex allergy status: Secondary | ICD-10-CM | POA: Insufficient documentation

## 2014-03-19 HISTORY — PX: DILATATION & CURETTAGE/HYSTEROSCOPY WITH TRUECLEAR: SHX6353

## 2014-03-19 LAB — CBC
HEMATOCRIT: 37.6 % (ref 36.0–46.0)
Hemoglobin: 11.8 g/dL — ABNORMAL LOW (ref 12.0–15.0)
MCH: 25.1 pg — ABNORMAL LOW (ref 26.0–34.0)
MCHC: 31.4 g/dL (ref 30.0–36.0)
MCV: 80 fL (ref 78.0–100.0)
Platelets: 314 10*3/uL (ref 150–400)
RBC: 4.7 MIL/uL (ref 3.87–5.11)
RDW: 15 % (ref 11.5–15.5)
WBC: 9.2 10*3/uL (ref 4.0–10.5)

## 2014-03-19 LAB — URINALYSIS, ROUTINE W REFLEX MICROSCOPIC
BILIRUBIN URINE: NEGATIVE
Glucose, UA: NEGATIVE mg/dL
Ketones, ur: NEGATIVE mg/dL
NITRITE: NEGATIVE
PROTEIN: NEGATIVE mg/dL
SPECIFIC GRAVITY, URINE: 1.025 (ref 1.005–1.030)
Urobilinogen, UA: 0.2 mg/dL (ref 0.0–1.0)
pH: 5.5 (ref 5.0–8.0)

## 2014-03-19 LAB — URINE MICROSCOPIC-ADD ON

## 2014-03-19 LAB — PREGNANCY, URINE: Preg Test, Ur: NEGATIVE

## 2014-03-19 SURGERY — DILATATION & CURETTAGE/HYSTEROSCOPY WITH TRUCLEAR
Anesthesia: General | Site: Uterus

## 2014-03-19 MED ORDER — LIDOCAINE HCL (CARDIAC) 20 MG/ML IV SOLN
INTRAVENOUS | Status: DC | PRN
  Administered 2014-03-19: 50 mg via INTRAVENOUS

## 2014-03-19 MED ORDER — FENTANYL CITRATE 0.05 MG/ML IJ SOLN
INTRAMUSCULAR | Status: DC | PRN
  Administered 2014-03-19: 50 ug via INTRAVENOUS
  Administered 2014-03-19 (×2): 25 ug via INTRAVENOUS

## 2014-03-19 MED ORDER — KETOROLAC TROMETHAMINE 30 MG/ML IJ SOLN
INTRAMUSCULAR | Status: AC
  Filled 2014-03-19: qty 1

## 2014-03-19 MED ORDER — SODIUM CHLORIDE 0.9 % IR SOLN
Status: DC | PRN
  Administered 2014-03-19 (×2): 3000 mL

## 2014-03-19 MED ORDER — FENTANYL CITRATE 0.05 MG/ML IJ SOLN
INTRAMUSCULAR | Status: AC
  Filled 2014-03-19: qty 2

## 2014-03-19 MED ORDER — ONDANSETRON HCL 4 MG/2ML IJ SOLN
INTRAMUSCULAR | Status: AC
  Filled 2014-03-19: qty 2

## 2014-03-19 MED ORDER — DEXAMETHASONE SODIUM PHOSPHATE 10 MG/ML IJ SOLN
INTRAMUSCULAR | Status: DC | PRN
  Administered 2014-03-19: 10 mg via INTRAVENOUS

## 2014-03-19 MED ORDER — PROPOFOL 10 MG/ML IV EMUL
INTRAVENOUS | Status: AC
  Filled 2014-03-19: qty 20

## 2014-03-19 MED ORDER — MIDAZOLAM HCL 2 MG/2ML IJ SOLN
INTRAMUSCULAR | Status: AC
  Filled 2014-03-19: qty 2

## 2014-03-19 MED ORDER — LACTATED RINGERS IV SOLN
INTRAVENOUS | Status: DC
Start: 2014-03-19 — End: 2014-03-19
  Administered 2014-03-19 (×2): via INTRAVENOUS

## 2014-03-19 MED ORDER — SODIUM CHLORIDE 0.9 % IJ SOLN
INTRAMUSCULAR | Status: AC
  Filled 2014-03-19: qty 50

## 2014-03-19 MED ORDER — KETOROLAC TROMETHAMINE 30 MG/ML IJ SOLN
INTRAMUSCULAR | Status: DC | PRN
  Administered 2014-03-19: 30 mg via INTRAVENOUS

## 2014-03-19 MED ORDER — GLYCINE 1.5 % IR SOLN: Status: DC | PRN

## 2014-03-19 MED ORDER — DEXAMETHASONE SODIUM PHOSPHATE 10 MG/ML IJ SOLN
INTRAMUSCULAR | Status: AC
  Filled 2014-03-19: qty 1

## 2014-03-19 MED ORDER — MIDAZOLAM HCL 5 MG/5ML IJ SOLN
INTRAMUSCULAR | Status: DC | PRN
  Administered 2014-03-19: 2 mg via INTRAVENOUS

## 2014-03-19 MED ORDER — MEPERIDINE HCL 25 MG/ML IJ SOLN: 6.2500 mg | INTRAMUSCULAR | Status: DC | PRN

## 2014-03-19 MED ORDER — KETOROLAC TROMETHAMINE 30 MG/ML IJ SOLN: 15.0000 mg | Freq: Once | INTRAMUSCULAR | Status: DC | PRN

## 2014-03-19 MED ORDER — ONDANSETRON HCL 4 MG/2ML IJ SOLN: 4.0000 mg | Freq: Once | INTRAMUSCULAR | Status: DC | PRN

## 2014-03-19 MED ORDER — FENTANYL CITRATE 0.05 MG/ML IJ SOLN
25.0000 ug | INTRAMUSCULAR | Status: DC | PRN
  Administered 2014-03-19 (×3): 25 ug via INTRAVENOUS

## 2014-03-19 MED ORDER — ONDANSETRON HCL 4 MG/2ML IJ SOLN
INTRAMUSCULAR | Status: DC | PRN
  Administered 2014-03-19: 4 mg via INTRAVENOUS

## 2014-03-19 MED ORDER — SODIUM CHLORIDE 0.9 % IJ SOLN: INTRAMUSCULAR | Status: DC | PRN

## 2014-03-19 MED ORDER — PROPOFOL 10 MG/ML IV BOLUS
INTRAVENOUS | Status: DC | PRN
  Administered 2014-03-19: 30 mg via INTRAVENOUS
  Administered 2014-03-19: 150 mg via INTRAVENOUS

## 2014-03-19 MED ORDER — LIDOCAINE HCL (CARDIAC) 20 MG/ML IV SOLN
INTRAVENOUS | Status: AC
  Filled 2014-03-19: qty 5

## 2014-03-19 MED ORDER — VASOPRESSIN 20 UNIT/ML IJ SOLN
INTRAMUSCULAR | Status: AC
  Filled 2014-03-19: qty 1

## 2014-03-19 SURGICAL SUPPLY — 38 items
BLADE INCISOR TRUC PLUS 2.9 (ABLATOR) IMPLANT
CANISTERS HI-FLOW 3000CC (CANNISTER) ×3 IMPLANT
CATH FOLEY 2WAY SLVR 30CC 16FR (CATHETERS) IMPLANT
CATH FOLEY LATEX FREE 12 FR (CATHETERS) ×2
CATH FOLEY LATEX FREE 14FR (CATHETERS) ×2
CATH FOLEY LF 12 FR (CATHETERS) IMPLANT
CATH FOLEY LF 14FR (CATHETERS) IMPLANT
CATH PEDI SILICON 3CC 8FR (CATHETERS) IMPLANT
CATH ROBINSON RED A/P 16FR (CATHETERS) ×1 IMPLANT
CLOTH BEACON ORANGE TIMEOUT ST (SAFETY) ×2 IMPLANT
CONTAINER PREFILL 10% NBF 60ML (FORM) ×4 IMPLANT
DRAPE HYSTEROSCOPY (DRAPE) ×2 IMPLANT
DRSG TELFA 3X8 NADH (GAUZE/BANDAGES/DRESSINGS) ×2 IMPLANT
ELECTRODE ROLLER VERSAPOINT (ELECTRODE) IMPLANT
ELECTRODE RT ANGLE VERSAPOINT (CUTTING LOOP) IMPLANT
GLOVE BIOGEL PI IND STRL 8 (GLOVE) ×1 IMPLANT
GLOVE BIOGEL PI INDICATOR 8 (GLOVE) ×1
GLOVE ECLIPSE 7.5 STRL STRAW (GLOVE) ×4 IMPLANT
GOWN STRL REUS W/TWL LRG LVL3 (GOWN DISPOSABLE) ×4 IMPLANT
INCISOR TRUC PLUS BLADE 2.9 (ABLATOR) ×2
KIT HYSTEROSCOPY TRUCLEAR (ABLATOR) IMPLANT
LOOP ANGLED CUTTING 22FR (CUTTING LOOP) IMPLANT
MORCELLATOR RECIP TRUCLEAR 4.0 (ABLATOR) IMPLANT
NDL SPNL 22GX3.5 QUINCKE BK (NEEDLE) ×1 IMPLANT
NEEDLE SPNL 22GX3.5 QUINCKE BK (NEEDLE) ×2 IMPLANT
PACK VAGINAL MINOR WOMEN LF (CUSTOM PROCEDURE TRAY) ×2 IMPLANT
PAD DRESSING TELFA 3X8 NADH (GAUZE/BANDAGES/DRESSINGS) ×1 IMPLANT
PAD OB MATERNITY 4.3X12.25 (PERSONAL CARE ITEMS) ×2 IMPLANT
PAD PREP 24X48 CUFFED NSTRL (MISCELLANEOUS) ×2 IMPLANT
PLUG CATH AND CAP STER (CATHETERS) ×1 IMPLANT
SET BERKELEY SUCTION TUBING (SUCTIONS) ×1 IMPLANT
SYR 30ML LL (SYRINGE) IMPLANT
SYR CONTROL 10ML LL (SYRINGE) ×2 IMPLANT
SYRINGE 10CC LL (SYRINGE) ×1 IMPLANT
TOWEL OR 17X24 6PK STRL BLUE (TOWEL DISPOSABLE) ×4 IMPLANT
VACURETTE 8 RIGID CVD (CANNULA) ×1 IMPLANT
VACURETTE 9 RIGID CVD (CANNULA) IMPLANT
WATER STERILE IRR 1000ML POUR (IV SOLUTION) ×2 IMPLANT

## 2014-03-19 NOTE — H&P (Signed)
Sharon Simpson is an 48 y.o. female. For preop exam. Patient scheduled for resectoscopic polypectomy at the end of this week as a result of her dysfunctional uterine bleeding and endometrial polyps. Her workup has consisted of the following:  Endometrium, biopsy, uterus  - PROLIFERATIVE-TYPE ENDOMETRIUM WITH BREAKDOWN  The patient's lab work indicated that her hemoglobin was 10.0 with a hemoglobin of 31.2 and platelet count 344,000 . She was started on iron supplementation 1 by mouth twice a day. She was also started on Megace 40 mg twice a day for 10 days to stop her bleeding. Her FSH, prolactin, TSH and blood sugar were all normal as was her Pap smear.   sonohysterogram:  Uterus measuring 9.8 x 0.0 x 5.6 cm with endometrial stripe of 20.7 mm. Patient had a subserous myoma measuring 7.0 x 5.1 x 6.5 cm with a feeder vessel seen from the uterus. Right ovary thinwall avascular cyst measuring 31 x 22 x 24 mm. left ovary was normal. No fluid in the cul-de-sac. After the cervix was cleansed with Betadine solution a sterile intrauterine catheter was introduced into the uterine cavity whereby normal saline was instilled and several endometrial polyps were noted the largest one measuring 23 x 20 x 18 mm.  Pertinent Gynecological History:  Menses: intermenstrual bleeding  Bleeding: intermenstrual bleeding  Contraception: vasectomy  DES exposure: denies  Blood transfusions: none  Sexually transmitted diseases: none  Previous GYN Procedures: laparotomy/right ovarian cystectomy/appendectomy/3 NSVD  Last mammogram: normal Date: 2011  Last pap: normal Date: 2015  OB History: G3 P3  Menstrual History:  Menarche age: 74  No LMP recorded.  No past medical history on file.  Past Surgical History   Procedure  Laterality  Date   .  Appendectomy       48 YR OLD   .  Wrist ganglion excision     No family history on file.  Social History: reports that she has quit smoking. She does not have any smokeless  tobacco history on file. She reports that she does not drink alcohol. Her drug history is not on file.  Allergies:  Allergies   Allergen  Reactions   .  Codeine    .  Latex    .  Tetracyclines & Related    (Not in a hospital admission)  REVIEW OF SYSTEMS: A ROS was performed and pertinent positives and negatives are included in the history.  GENERAL: No fevers or chills. HEENT: No change in vision, no earache, sore throat or sinus congestion. NECK: No pain or stiffness. CARDIOVASCULAR: No chest pain or pressure. No palpitations. PULMONARY: No shortness of breath, cough or wheeze. GASTROINTESTINAL: No abdominal pain, nausea, vomiting or diarrhea, melena or bright red blood per rectum. GENITOURINARY: No urinary frequency, urgency, hesitancy or dysuria. MUSCULOSKELETAL: No joint or muscle pain, no back pain, no recent trauma. DERMATOLOGIC: No rash, no itching, no lesions. ENDOCRINE: No polyuria, polydipsia, no heat or cold intolerance. No recent change in weight. HEMATOLOGICAL: No anemia or easy bruising or bleeding. NEUROLOGIC: No headache, seizures, numbness, tingling or weakness. PSYCHIATRIC: No depression, no loss of interest in normal activity or change in sleep pattern.  Blood pressure 124/76.  Physical Exam:  HEENT:unremarkable  Neck:Supple, midline, no thyroid megaly, no carotid bruits  Lungs: Clear to auscultation no rhonchi's or wheezes  Heart:Regular rate and rhythm, no murmurs or gallops  Breast Exam: Not done  Abdomen: Soft nontender no rebound or guarding  Pelvic:BUS within normal limits  Vagina: Some dark menstrual  blood present  Cervix: No lesions or discharge  Uterus: Slightly retroverted  Adnexa: No palpable masses or tenderness  Extremities: No cords, no edema  Rectal: Not done  Assessment/plan:  #1 dysfunctional uterine bleeding  #2 endometrial polyps  #3 small right ovarian cyst  #4 anemia  Patient scheduled for resectoscope polypectomy at the end of this week the  following risks were discussed:  Patient was counseled as to the risk of surgery to include the following:  1. Infection (prohylactic antibiotics will be administered)  2. DVT/Pulmonary Embolism (prophylactic pneumo compression stockings will be used)  3.Trauma to internal organs requiring additional surgical procedure to repair any injury to  Internal organs requiring perhaps additional hospitalization days.  4.Hemmorhage requiring transfusion and blood products which carry risks such as anaphylactic reaction, hepatitis and AIDS  Patient had received literature information on the procedure scheduled and all her questions were answered and fully accepts all risk.  Starlyn Skeans FernandezMD8:43 AMTD@Note : This dictation was prepared with Dragon/digital dictation along withSmart phrase technology. Any transcriptional errors that result from this process are unintentional.

## 2014-03-19 NOTE — Discharge Instructions (Signed)

## 2014-03-19 NOTE — Telephone Encounter (Deleted)
"  Please informed patient that I reviewed her ultrasound I know that she had to leave because of an emergency. Tell her ultrasound was normal there were no follicles seen on her ovaries. I would recommend she follow up with a reproductive endocrinologist Dr. Governor Specking as I had recommended before for donor egg with IVF to help her conceive and this is still her wishes. Though her lipid profile was slightly elevated and I would like her to go on a low cholesterol diet and to engage in exercise 3-4 times a week for one hour and I would like to repeat her fasting lipid profile in 6 months. Her Pap smear and the rest of her labs otherwise were normal.

## 2014-03-19 NOTE — Telephone Encounter (Signed)
ERROR-Dr. Moshe Salisbury had sent me this message but this is not correct patient. I will check back with him for correct patient.

## 2014-03-19 NOTE — Transfer of Care (Signed)
Immediate Anesthesia Transfer of Care Note  Patient: Sharon Simpson  Procedure(s) Performed: Procedure(s) with comments: DILATATION & CURETTAGE/HYSTEROSCOPY WITH TRUCLEAR (N/A) - Resectoscopic Polypectomy  2nd choice 1:00pm same date  Patient Location: PACU  Anesthesia Type:General  Level of Consciousness: sedated  Airway & Oxygen Therapy: Patient Spontanous Breathing and Patient connected to nasal cannula oxygen  Post-op Assessment: Report given to PACU RN and Post -op Vital signs reviewed and stable  Post vital signs: stable  Complications: No apparent anesthesia complications

## 2014-03-19 NOTE — Anesthesia Preprocedure Evaluation (Signed)
Anesthesia Evaluation  Patient identified by MRN, date of birth, ID band Patient awake    Reviewed: Allergy & Precautions, H&P , NPO status , Patient's Chart, lab work & pertinent test results  Airway Mallampati: II TM Distance: >3 FB Neck ROM: full    Dental no notable dental hx. (+) Teeth Intact   Pulmonary former smoker,    Pulmonary exam normal       Cardiovascular negative cardio ROS      Neuro/Psych negative neurological ROS  negative psych ROS   GI/Hepatic negative GI ROS, Neg liver ROS,   Endo/Other  negative endocrine ROS  Renal/GU negative Renal ROS     Musculoskeletal   Abdominal Normal abdominal exam  (+)   Peds  Hematology   Anesthesia Other Findings   Reproductive/Obstetrics negative OB ROS                           Anesthesia Physical Anesthesia Plan  ASA: I  Anesthesia Plan: General   Post-op Pain Management:    Induction: Intravenous  Airway Management Planned: LMA  Additional Equipment:   Intra-op Plan:   Post-operative Plan:   Informed Consent: I have reviewed the patients History and Physical, chart, labs and discussed the procedure including the risks, benefits and alternatives for the proposed anesthesia with the patient or authorized representative who has indicated his/her understanding and acceptance.     Plan Discussed with: CRNA, Anesthesiologist and Surgeon  Anesthesia Plan Comments:         Anesthesia Quick Evaluation

## 2014-03-19 NOTE — Interval H&P Note (Signed)
History and Physical Interval Note:  03/19/2014 7:16 AM  Sharon Simpson  has presented today for surgery, with the diagnosis of endometrial polyp  The various methods of treatment have been discussed with the patient and family. After consideration of risks, benefits and other options for treatment, the patient has consented to  Procedure(s) with comments: DILATATION & CURETTAGE/HYSTEROSCOPY WITH TRUCLEAR (N/A) - Resectoscopic Polypectomy  2nd choice 1:00pm same date as a surgical intervention .  The patient's history has been reviewed, patient examined, no change in status, stable for surgery.  I have reviewed the patient's chart and labs.  Questions were answered to the patient's satisfaction.     Terrance Mass

## 2014-03-19 NOTE — Op Note (Signed)
   03/19/2014  9:05 AM  PATIENT:  Sharon Simpson  48 y.o. female  PRE-OPERATIVE DIAGNOSIS:  endometrial polyp, dysfunction uterine bleeding, anemia  POST-OPERATIVE DIAGNOSIS:  endometrial polyp dysfunctional uterine bleeding, anemia  PROCEDURE:  Procedure(s): DILATATION & CURETTAGE/HYSTEROSCOPY WITH TRUCLEAR  SURGEON:  Surgeon(s): Terrance Mass, MD  ANESTHESIA:   general  FINDINGS: Patient with multiple intrauterine polyps scattered throughout the uterine cavity. Both tubal ostia were identified.  DESCRIPTION OF OPERATION:DESCRIPTION OF OPERATION: The patient was taken to the operating room where she underwent successful general endotracheal anesthesia. The patient was identified during the time out as well as procedure to be performed. The patient received Cefotan 2 g IV for infection prophylaxis and PAS stockings for DVT prophylaxis. The vagina and perineum were prepped and draped in usual sterile fashion and the legs were placed in the high lithotomy position. A red rubber Sharon Simpson was inserted to evacuate the bladder was contents for approximately 100 . Exam under anesthesia demonstrated a uterus 10 week size with no palpable adnexal masses. A weighted speculum was placed on the posterior vaginal vault. A single-tooth tenaculum was placed on the anterior cervical lip. The uterus sounded to 9cm . Pratt dilators to size 0  mm was utilized to dilate the cervical canal. The 5 mm Smith and Nephew operative resectoscope was utilized. The True Clear Ultra  Resectoscope with a 2.9 reciprocating blade was used to remove all the intrauterine polyps. Normal saline was the distending media. It was noted at this time the patient had  multiple polyps scattered throughout the uterine cavity. . With the true clear morcellator the endometrial polyps were resected and passed off the operative field for histological evaluation. An 8 mm suction curet was intermittently introduced to the uterine cavity to  completely evacuate all its contents along with a serrated curette in all specimen was made for pathological evaluation. Adequate hemostasis was present. A 10 cc Foley was introduced into the uterine cavity for additional tamponade affect which will be removed 30 minutes before discharge from the recover room. Patient tolerated procedure well was extubated and transferred to recovery room stable vital signs. She received Toradol 30 mg IM in route to the recover room. EBL was minimal. Fluid deficit was 400 cc cc's. Patient instructed to continue her Megace 40 mg twice a day for an additional 2 weeks and also to continue her iron tablet 1 by mouth daily.   ESTIMATED BLOOD LOSS: 50 cc  Intake/Output Summary (Last 24 hours) at 03/19/14 0905 Last data filed at 03/19/14 0849  Gross per 24 hour  Intake   1100 ml  Output    125 ml  Net    975 ml     BLOOD ADMINISTERED:none   LOCAL MEDICATIONS USED:  NONE  SPECIMEN:  Source of Specimen:  Endometrial polyps  DISPOSITION OF SPECIMEN:  PATHOLOGY  COUNTS:  YES  PLAN OF CARE: Transfer to PACU  Starlyn Skeans FernandezMD9:05 AMTD@  Note: This dictation was prepared with  Dragon/digital dictation along withSmart phrase technology. Any transcriptional errors that result from this process are unintentional.

## 2014-03-19 NOTE — Anesthesia Postprocedure Evaluation (Signed)
  Anesthesia Post-op Note  Patient: Sharon Simpson  Procedure(s) Performed: Procedure(s) with comments: DILATATION & CURETTAGE/HYSTEROSCOPY WITH TRUCLEAR (N/A) - Resectoscopic Polypectomy  2nd choice 1:00pm same date  Patient Location: PACU  Anesthesia Type:General  Level of Consciousness: awake, alert  and oriented  Airway and Oxygen Therapy: Patient Spontanous Breathing  Post-op Pain: mild  Post-op Assessment: Post-op Vital signs reviewed, Patient's Cardiovascular Status Stable, Respiratory Function Stable, Patent Airway, No signs of Nausea or vomiting and Pain level controlled  Post-op Vital Signs: Reviewed and stable  Last Vitals:  Filed Vitals:   03/19/14 1000  BP: 107/71  Pulse: 81  Temp:   Resp: 17    Complications: No apparent anesthesia complications

## 2014-03-22 ENCOUNTER — Encounter (HOSPITAL_COMMUNITY): Payer: Self-pay | Admitting: Gynecology

## 2014-04-06 ENCOUNTER — Encounter: Payer: Self-pay | Admitting: Gynecology

## 2014-04-06 ENCOUNTER — Ambulatory Visit (INDEPENDENT_AMBULATORY_CARE_PROVIDER_SITE_OTHER): Payer: 59 | Admitting: Gynecology

## 2014-04-06 VITALS — BP 118/72

## 2014-04-06 DIAGNOSIS — Z09 Encounter for follow-up examination after completed treatment for conditions other than malignant neoplasm: Secondary | ICD-10-CM

## 2014-04-06 NOTE — Progress Notes (Signed)
   Patient  2 weeks ago underwent resectoscopic polypectomy for endometrial polyps contributing to dysfunction uterine bleeding and anemia. Patient is doing well. Pictures for surgery were shown to the patient as well as the following pathology report discussed:  Diagnosis Endometrium, curettage - INACTIVE GLANDS WITH PSEUDODECIDUALIZED STROMA CONSISTENT WITH PROGESTATIONAL EFFECT. - BENIGN ENDOMETRIAL POLYP. - NO HYPERPLASIA OR CARCINOMA.  The patient's most recent hemoglobin on April 10 was 11.8 and she's currently taking iron supplementation daily  Exam: Abdomen: Soft nontender no rebound guarding Bartholin urethra Skene glands: Within normal limits Vagina: Blood-tinged mucus discharge Cervix: No lesions or discharge Uterus: Anteverted normal size shape and consistency Adnexa: No palpable mass or tingling rectal exam: Not done   Assessment/plan: Patient 2 weeks status post resectoscopic polypectomy doing well. Patient has resumed full normal activity. Patient was recommended to stay on her arm tablet 1 by mouth daily. She is otherwise scheduled to return back to the office in 1 year or when necessary. Patient was provided with a requisition to schedule her mammogram which is overdue.

## 2014-04-12 ENCOUNTER — Telehealth: Payer: Self-pay | Admitting: *Deleted

## 2014-04-12 MED ORDER — MEGESTROL ACETATE 40 MG PO TABS: 40.0000 mg | ORAL_TABLET | Freq: Two times a day (BID) | ORAL | Status: DC

## 2014-04-12 NOTE — Telephone Encounter (Signed)
Pt informed with the below note, rx sent. Pt also would like to know if taking megace for a long time could cause you to bleed heavy? Pt thought she read this somewhere. Also if taking Advil would help slow bleeding down as well?

## 2014-04-12 NOTE — Telephone Encounter (Signed)
Pt had D & C on 03/19/14 had been taking megace since march due to irregular bleeding. Pt said after she had surgery noticed spotting only, this past Saturday she has heavy bleeding and this am heavy bleeding changing pad every 1 hour. Bleeding not as heavy now, asked if normal to have had heavy bleeding after surgery? Please advise

## 2014-04-12 NOTE — Telephone Encounter (Signed)
Not unusual to have an irregular period after surgery. If restarts call in Megace 40MG  BID for 10 days.

## 2014-04-12 NOTE — Telephone Encounter (Signed)
Long term usage yes. She could take Motrin 800 mg tid for next 5- 7 days instead

## 2014-04-13 NOTE — Telephone Encounter (Signed)
Left answer to question on voicemail.

## 2014-05-04 ENCOUNTER — Telehealth: Payer: Self-pay | Admitting: *Deleted

## 2014-05-04 NOTE — Telephone Encounter (Signed)
I would continue on Megace as prescribed this week with office appointment with Dr. Toney Rakes next week

## 2014-05-04 NOTE — Telephone Encounter (Signed)
Dr.Fernandez I explained the below to patient and told her Dr.Fontaine recommendations and she declined OV next. States she will be out of town for her son on Monday and Tuesday. I explained to patient that due to the below questions OV would be preferred by you as well. She asked if you could address the follow questions:  1.Her concern is why is she still bleeding after having surgery? 2.States she does have fibroid could that be causing the bleeding to continue as well? 3. What is the next step since bleeding is continuing?

## 2014-05-04 NOTE — Telephone Encounter (Signed)
(  [facility] patient) pt had D & C back in April 2015 c/o bleeding before surgery and bleeding still after surgery. Pt has been tried several rounds of megace and bleeding will slow down to point of spotting but never fully stop. Pt said Sunday very heavy bleeding started took megace as directed to medium flow. Her concern is why is she still bleeding after having surgery? States she does have fibroid could that be causing the bleeding to continue as well? I did offer OV to patient but she declined stating she wasn't sure if OV would help. Recommendations? Please advise

## 2015-04-12 ENCOUNTER — Telehealth: Payer: Self-pay | Admitting: *Deleted

## 2015-04-12 DIAGNOSIS — N921 Excessive and frequent menstruation with irregular cycle: Secondary | ICD-10-CM

## 2015-04-12 MED ORDER — MEGESTROL ACETATE 40 MG PO TABS: 40.0000 mg | ORAL_TABLET | Freq: Two times a day (BID) | ORAL | Status: DC

## 2015-04-12 NOTE — Telephone Encounter (Signed)
Pt called c/o heavy bleeding changing tampon and pads every 2 hours, bleeding x 7 days now, had D&C in April 2015. No pain. I advised OV today, pt said last time you gave her Rx to help bleeding stop. Pt is requesting Rx. Please advise

## 2015-04-12 NOTE — Telephone Encounter (Signed)
Pt aware with the below, rx sent, front desk will call to schedule [facility]

## 2015-04-12 NOTE — Telephone Encounter (Signed)
Call in Megace 40 MG BID for seven days BUT will need office visit with sonohysterogram and biopsy next week.

## 2015-04-14 ENCOUNTER — Other Ambulatory Visit: Payer: Self-pay | Admitting: Gynecology

## 2015-04-14 DIAGNOSIS — N939 Abnormal uterine and vaginal bleeding, unspecified: Secondary | ICD-10-CM

## 2015-04-18 ENCOUNTER — Ambulatory Visit (INDEPENDENT_AMBULATORY_CARE_PROVIDER_SITE_OTHER): Payer: 59

## 2015-04-18 ENCOUNTER — Other Ambulatory Visit: Payer: Self-pay | Admitting: Gynecology

## 2015-04-18 ENCOUNTER — Telehealth: Payer: Self-pay | Admitting: Gynecology

## 2015-04-18 ENCOUNTER — Ambulatory Visit (INDEPENDENT_AMBULATORY_CARE_PROVIDER_SITE_OTHER): Payer: 59 | Admitting: Gynecology

## 2015-04-18 DIAGNOSIS — R9389 Abnormal findings on diagnostic imaging of other specified body structures: Secondary | ICD-10-CM

## 2015-04-18 DIAGNOSIS — D252 Subserosal leiomyoma of uterus: Secondary | ICD-10-CM | POA: Diagnosis not present

## 2015-04-18 DIAGNOSIS — R938 Abnormal findings on diagnostic imaging of other specified body structures: Secondary | ICD-10-CM

## 2015-04-18 DIAGNOSIS — N83201 Unspecified ovarian cyst, right side: Secondary | ICD-10-CM

## 2015-04-18 DIAGNOSIS — N852 Hypertrophy of uterus: Secondary | ICD-10-CM | POA: Diagnosis not present

## 2015-04-18 DIAGNOSIS — N8320 Unspecified ovarian cysts: Secondary | ICD-10-CM | POA: Diagnosis not present

## 2015-04-18 DIAGNOSIS — N939 Abnormal uterine and vaginal bleeding, unspecified: Secondary | ICD-10-CM

## 2015-04-18 DIAGNOSIS — N921 Excessive and frequent menstruation with irregular cycle: Secondary | ICD-10-CM

## 2015-04-18 DIAGNOSIS — N920 Excessive and frequent menstruation with regular cycle: Secondary | ICD-10-CM

## 2015-04-18 NOTE — Progress Notes (Signed)
   Patient is a 49 year old who had called the office early this month complaining of a a day heavy menstrual cycle with passage of large clots prior to that she was having normal menstrual cycles. Her husband has had a vasectomy. Patient 03/19/2014 at had resectoscopic polypectomy which was benign as a result of her heavy bleeding. She is here for sonohysterogram and endometrial biopsy. Before she came in today we had started her on Megace 40 mg twice a day to stop her bleeding. She's currently not bleeding.  Ultrasound: Uterus measured 8.5 x 7.9 x 6.2 cm with endometrial stripe of 18 mm (last menstrual cycle 13 days ago) a subserous myoma measuring 44 x 40 x 45 mm was noted and was described as having decreased in size from previous ultrasound. Right ovary had a thinwall primarily echo-free cyst measuring 39 x 33 x 37 mm her size 3.6 cm with a small calcification and wall measuring 3.7 mm. Arterial blood flow of the ovary was noted left ovary was normal. No fluid in the cul-de-sac. Afterwards the surgeon's cleansed with Betadine solution and a sterile catheter was introduced into the uterine cavity normal saline was instilled. There was no defect noted in the uterine cavity. Following this the cervix was cleansed with Betadine solution once again and a sterile Pipelle was introduced into the uterine cavity and endometrial biopsy was obtained and tissue submitted for histological evaluation.  Assessment/plan: Isolated event of heavy menstrual cycle lasting 8 days recently. Patient otherwise normal menstrual cycle. Incidental finding of a right thinwall ovarian cyst average size 3.6 cm with a small calcified area in the wall. We are going to check a CA 125 today in follow-up with an ultrasound in 3 months. As to her cycles will continue to monitor any regular bleeding she'll report to the office.

## 2015-04-18 NOTE — Patient Instructions (Signed)
Ovarian Cyst An ovarian cyst is a fluid-filled sac that forms on an ovary. The ovaries are small organs that produce eggs in women. Various types of cysts can form on the ovaries. Most are not cancerous. Many do not cause problems, and they often go away on their own. Some may cause symptoms and require treatment. Common types of ovarian cysts include:  Functional cysts--These cysts may occur every month during the menstrual cycle. This is normal. The cysts usually go away with the next menstrual cycle if the woman does not get pregnant. Usually, there are no symptoms with a functional cyst.  Endometrioma cysts--These cysts form from the tissue that lines the uterus. They are also called "chocolate cysts" because they become filled with blood that turns brown. This type of cyst can cause pain in the lower abdomen during intercourse and with your menstrual period.  Cystadenoma cysts--This type develops from the cells on the outside of the ovary. These cysts can get very big and cause lower abdomen pain and pain with intercourse. This type of cyst can twist on itself, cut off its blood supply, and cause severe pain. It can also easily rupture and cause a lot of pain.  Dermoid cysts--This type of cyst is sometimes found in both ovaries. These cysts may contain different kinds of body tissue, such as skin, teeth, hair, or cartilage. They usually do not cause symptoms unless they get very big.  Theca lutein cysts--These cysts occur when too much of a certain hormone (human chorionic gonadotropin) is produced and overstimulates the ovaries to produce an egg. This is most common after procedures used to assist with the conception of a baby (in vitro fertilization). CAUSES   Fertility drugs can cause a condition in which multiple large cysts are formed on the ovaries. This is called ovarian hyperstimulation syndrome.  A condition called polycystic ovary syndrome can cause hormonal imbalances that can lead to  nonfunctional ovarian cysts. SIGNS AND SYMPTOMS  Many ovarian cysts do not cause symptoms. If symptoms are present, they may include:  Pelvic pain or pressure.  Pain in the lower abdomen.  Pain during sexual intercourse.  Increasing girth (swelling) of the abdomen.  Abnormal menstrual periods.  Increasing pain with menstrual periods.  Stopping having menstrual periods without being pregnant. DIAGNOSIS  These cysts are commonly found during a routine or annual pelvic exam. Tests may be ordered to find out more about the cyst. These tests may include:  Ultrasound.  X-ray of the pelvis.  CT scan.  MRI.  Blood tests. TREATMENT  Many ovarian cysts go away on their own without treatment. Your health care provider may want to check your cyst regularly for 2-3 months to see if it changes. For women in menopause, it is particularly important to monitor a cyst closely because of the higher rate of ovarian cancer in menopausal women. When treatment is needed, it may include any of the following:  A procedure to drain the cyst (aspiration). This may be done using a long needle and ultrasound. It can also be done through a laparoscopic procedure. This involves using a thin, lighted tube with a tiny camera on the end (laparoscope) inserted through a small incision.  Surgery to remove the whole cyst. This may be done using laparoscopic surgery or an open surgery involving a larger incision in the lower abdomen.  Hormone treatment or birth control pills. These methods are sometimes used to help dissolve a cyst. HOME CARE INSTRUCTIONS   Only take over-the-counter   or prescription medicines as directed by your health care provider.  Follow up with your health care provider as directed.  Get regular pelvic exams and Pap tests. SEEK MEDICAL CARE IF:   Your periods are late, irregular, or painful, or they stop.  Your pelvic pain or abdominal pain does not go away.  Your abdomen becomes  larger or swollen.  You have pressure on your bladder or trouble emptying your bladder completely.  You have pain during sexual intercourse.  You have feelings of fullness, pressure, or discomfort in your stomach.  You lose weight for no apparent reason.  You feel generally ill.  You become constipated.  You lose your appetite.  You develop acne.  You have an increase in body and facial hair.  You are gaining weight, without changing your exercise and eating habits.  You think you are pregnant. SEEK IMMEDIATE MEDICAL CARE IF:   You have increasing abdominal pain.  You feel sick to your stomach (nauseous), and you throw up (vomit).  You develop a fever that comes on suddenly.  You have abdominal pain during a bowel movement.  Your menstrual periods become heavier than usual. MAKE SURE YOU:  Understand these instructions.  Will watch your condition.  Will get help right away if you are not doing well or get worse. Document Released: 11/26/2005 Document Revised: 12/01/2013 Document Reviewed: 08/03/2013 ExitCare Patient Information 2015 [employer]. This information is not intended to replace advice given to you by your health care provider. Make sure you discuss any questions you have with your health care provider.  

## 2015-04-18 NOTE — Telephone Encounter (Signed)
04/18/15-Pt was told today that her [facility] insurance covers the sonohysterogram with the $25.00 copay/wl

## 2015-04-22 ENCOUNTER — Other Ambulatory Visit: Payer: Self-pay

## 2015-04-27 ENCOUNTER — Encounter: Payer: Self-pay | Admitting: Gynecology

## 2015-05-02 ENCOUNTER — Other Ambulatory Visit: Payer: 59

## 2015-05-03 LAB — CA 125: CA 125: 16 U/mL (ref <35)

## 2015-07-22 ENCOUNTER — Ambulatory Visit: Payer: 59 | Admitting: Gynecology

## 2015-07-22 ENCOUNTER — Other Ambulatory Visit: Payer: 59

## 2015-07-29 ENCOUNTER — Ambulatory Visit: Payer: 59 | Admitting: Gynecology

## 2015-07-29 ENCOUNTER — Other Ambulatory Visit: Payer: 59

## 2015-08-12 ENCOUNTER — Other Ambulatory Visit: Payer: 59

## 2015-08-12 ENCOUNTER — Ambulatory Visit: Payer: 59 | Admitting: Gynecology

## 2015-09-21 ENCOUNTER — Telehealth: Payer: Self-pay | Admitting: *Deleted

## 2015-09-21 MED ORDER — LIDOCAINE-HYDROCORTISONE ACE 3-2.5 % RE KIT: 1.0000 | PACK | Freq: Two times a day (BID) | RECTAL | Status: DC

## 2015-09-21 MED ORDER — LIDOCAINE 5 % EX OINT: 1.0000 "application " | TOPICAL_OINTMENT | CUTANEOUS | Status: DC | PRN

## 2015-09-21 NOTE — Addendum Note (Signed)
Addended by: Alen Blew on: 09/21/2015 10:43 AM   Modules accepted: Orders

## 2015-09-21 NOTE — Telephone Encounter (Signed)
Anamantle wont be ready for pick up until tomorrow therefore lidocaine 5% ointment called into [facility]

## 2015-09-21 NOTE — Telephone Encounter (Signed)
Pt requests RX for hemorrhoid relief. One has been bothering her for 2 days, increased pain. Per [facility] Anamantle supoository kit. Use twice daily. OV if no relief. Pt informed [facility] CMA

## 2015-09-22 MED ORDER — HYDROCORTISONE ACE-PRAMOXINE 1-1 % RE FOAM: 1.0000 | Freq: Four times a day (QID) | RECTAL | Status: DC

## 2015-09-22 NOTE — Telephone Encounter (Signed)
[employer] didn't have the Anamantle therefore we sent Proctofoam HC 1% HC 1% Pramoxine to [facility]

## 2015-09-22 NOTE — Addendum Note (Signed)
Addended by: Alen Blew on: 09/22/2015 12:31 PM   Modules accepted: Orders

## 2015-09-26 ENCOUNTER — Telehealth: Payer: Self-pay

## 2015-09-26 ENCOUNTER — Other Ambulatory Visit: Payer: Self-pay | Admitting: Gynecology

## 2015-09-26 MED ORDER — LIDOCAINE (ANORECTAL) 5 % EX CREA: TOPICAL_CREAM | CUTANEOUS | Status: DC

## 2015-09-26 NOTE — Telephone Encounter (Signed)
Per pharmacy Recticare covered and available. rx sent.

## 2015-09-26 NOTE — Telephone Encounter (Signed)
You had prescribed Lidocaine 3%-HC 2.5% Gel.  Per Pharmacy note "This is not available. Please call in an alternative. Proctosol HC 2/5% is a $20.00 copayment.  Proctofoam HS is a $40 copayment. Please advise. Thanks.".

## 2015-09-26 NOTE — Telephone Encounter (Signed)
The following are considered alternatives depending on what is covered:  1. 5% RectiCare  2. 4 % H-Releve's  3. 5% Topicaine

## 2015-11-26 ENCOUNTER — Telehealth: Payer: 59 | Admitting: Family

## 2015-11-26 DIAGNOSIS — J019 Acute sinusitis, unspecified: Secondary | ICD-10-CM

## 2015-11-26 MED ORDER — AMOXICILLIN-POT CLAVULANATE 875-125 MG PO TABS: 1.0000 | ORAL_TABLET | Freq: Two times a day (BID) | ORAL | Status: DC

## 2015-11-26 NOTE — Progress Notes (Signed)
We are sorry that you are not feeling well.  Here is how we plan to help!  Based on what you have shared with me it looks like you have sinusitis.  Sinusitis is inflammation and infection in the sinus cavities of the head.  Based on your presentation I believe you most likely have Acute Bacterial Sinusitis.  This is an infection caused by bacteria and is treated with antibiotics. I have prescribed Augmentin, an antibiotic in the penicillin family, one tablet twice daily with food, for 7 days. You may use an oral decongestant such as Mucinex D or if you have glaucoma or high blood pressure use plain Mucinex. Saline nasal spray help and can safely be used as often as needed for congestion.  If you develop worsening sinus pain, fever or notice severe headache and vision changes, or if symptoms are not better after completion of antibiotic, please schedule an appointment with a health care provider.    Sinus infections are not as easily transmitted as other respiratory infection, however we still recommend that you avoid close contact with loved ones, especially the very young and elderly.  Remember to wash your hands thoroughly throughout the day as this is the number one way to prevent the spread of infection!  Home Care:  Only take medications as instructed by your medical team.  Complete the entire course of an antibiotic.  Do not take these medications with alcohol.  A steam or ultrasonic humidifier can help congestion.  You can place a towel over your head and breathe in the steam from hot water coming from a faucet.  Avoid close contacts especially the very young and the elderly.  Cover your mouth when you cough or sneeze.  Always remember to wash your hands.  Get Help Right Away If:  You develop worsening fever or sinus pain.  You develop a severe head ache or visual changes.  Your symptoms persist after you have completed your treatment plan.  Make sure you  Understand these  instructions.  Will watch your condition.  Will get help right away if you are not doing well or get worse.  Your e-visit answers were reviewed by a board certified advanced clinical practitioner to complete your personal care plan.  Depending on the condition, your plan could have included both over the counter or prescription medications.  If there is a problem please reply  once you have received a response from your provider.  Your safety is important to [place].  If you have drug allergies check your prescription carefully.    You can use MyChart to ask questions about today's visit, request a non-urgent call back, or ask for a work or school excuse for 24 hours related to this e-Visit. If it has been greater than 24 hours you will need to follow up with your provider, or enter a new e-Visit to address those concerns.  You will get an e-mail in the next two days asking about your experience.  I hope that your e-visit has been valuable and will speed your recovery. Thank you for using e-visits.

## 2016-04-18 ENCOUNTER — Ambulatory Visit (INDEPENDENT_AMBULATORY_CARE_PROVIDER_SITE_OTHER): Payer: 59 | Admitting: Family Medicine

## 2016-04-18 ENCOUNTER — Encounter: Payer: Self-pay | Admitting: Family Medicine

## 2016-04-18 VITALS — BP 120/78 | HR 84 | Ht 65.0 in | Wt 185.2 lb

## 2016-04-18 DIAGNOSIS — N920 Excessive and frequent menstruation with regular cycle: Secondary | ICD-10-CM | POA: Diagnosis not present

## 2016-04-18 DIAGNOSIS — R5383 Other fatigue: Secondary | ICD-10-CM

## 2016-04-18 DIAGNOSIS — E7800 Pure hypercholesterolemia, unspecified: Secondary | ICD-10-CM

## 2016-04-18 DIAGNOSIS — D509 Iron deficiency anemia, unspecified: Secondary | ICD-10-CM

## 2016-04-18 DIAGNOSIS — Z1211 Encounter for screening for malignant neoplasm of colon: Secondary | ICD-10-CM

## 2016-04-18 DIAGNOSIS — M1990 Unspecified osteoarthritis, unspecified site: Secondary | ICD-10-CM

## 2016-04-18 DIAGNOSIS — M19079 Primary osteoarthritis, unspecified ankle and foot: Secondary | ICD-10-CM

## 2016-04-18 NOTE — Progress Notes (Signed)
Chief Complaint  Patient presents with  . New patient    sees Dr.Fernandez and doesn't think that he has ever said that she was anemic, but used a machine at work and her Hgb was 10.0 and started taking slo iron also with her MVI/B12/D3 and she started feeling better (less tired) rechecked her Hgb and it was 10.3. Dr.Voytek told her she had arthritis andin her foot, she does have known in her toe but is now having issues with other parts of the body, hands.    She presents for evaluation of anemia.  3-4 months ago used monitor at work which showed Hg 10.  She started taking OTC iron once daily (slo-Fe), along with B12 and D3.  Her energy improved after 1 month of these supplements.  She rechecked the Hg, and it remained low at 10.3.  +heavy menses since spring 2015.  Has gotten a little better--no longer heavy the full 7 days.  Required use of Megace a few times to stop bleeding.  Periods now are heavy for 3-4 days.  Cycles are regular every month (had been frequent in the past, but now regular/monthly).  She is under the care of Dr. Toney Rakes (doesn't appear that she has seen him for a year).  Denies any GI symptoms, just occasional indigestion.  No bowel changes. She is 50 years old, has never had colonoscopy.  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 She wonders if she needs to be doing anything to treat this, and if it will get worse if she doesn't. Sometimes is very painful. Pain tolerable, doesn't use meds. Also some pain in her hands/fingers. She doesn't really want to take meds or seek treatment (and wanting to make sure that won't make things worse).  Past Medical History  Diagnosis Date  . Ovarian cyst     (surgery age 71)  . Low back pain     sees Dr. Lynann Bologna  . Arthritis of foot     R first MTP (xray 04/2012)  . Menorrhagia     Dr. Toney Rakes  . Iron deficiency anemia   . High cholesterol     per pt--improved with diet/exercise  . Herpes labialis     fever blisters when in the sun   Past  Surgical History  Procedure Laterality Date  . Appendectomy      50 YR OLD  . Wrist ganglion excision    . Dilatation & curettage/hysteroscopy with trueclear N/A 03/19/2014    Procedure: DILATATION & CURETTAGE/HYSTEROSCOPY WITH TRUCLEAR;  Surgeon: Terrance Mass, MD;  Location: [facility];  Service: Gynecology;  Laterality: N/A;  Resectoscopic Polypectomy   . Pyloric stenosis repair  infant   Social History   Social History  . Marital Status: Married    Spouse Name: N/A  . Number of Children: 3  . Years of Education: N/A   Occupational History  . 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 Smoking status: Former Smoker -- 1.00 packs/day for 8 years    Types: Cigarettes  . Smokeless tobacco: Never Used     Comment: quit age 37 (from age [age], 24 PPD)  . Alcohol Use: No  . Drug Use: No  . Sexual Activity:    Partners: Male     Comment: VASECTOMY   Other Topics Concern  . Not on file   Social History Narrative   Married.   Daughter is 3 rd physiatry resident in [place]  Son lives in [place], [place] grandchild    Youngest son is at [employer]   Family History  Problem Relation Age of Onset  . Rheum arthritis Mother   . Arthritis Mother   . COPD Mother   . Asthma Mother     child  . Alcohol abuse Father   . Heart disease Father 61  . Hypertension Father   . Irritable bowel syndrome Sister   . Bipolar disorder Sister   . Alcohol abuse Sister   . Colon polyps Sister     teenager  . Asthma Brother   . Mental illness Maternal Aunt     fixed delusions  . Hyperlipidemia Maternal Uncle   . Hyperlipidemia Maternal Grandmother   . Heart disease Maternal Grandmother     MI  . Stroke Maternal Grandmother   . Heart disease Paternal Grandmother 57    MI  . Cancer Paternal Grandfather     lung cancer  . Diabetes Neg Hx    Outpatient Encounter Prescriptions as of 04/18/2016  Medication Sig  . cholecalciferol (VITAMIN D) 1000  units tablet Take 1,000 Units by mouth daily.  . Ferrous Sulfate (IRON) 325 (65 Fe) MG TABS Take 65 mg by mouth daily.  . Multiple Vitamins-Minerals (MULTIVITAMIN WITH MINERALS) tablet Take 1 tablet by mouth daily.  . vitamin B-12 (CYANOCOBALAMIN) 1000 MCG tablet Take 1,000 mcg by mouth daily.  . valACYclovir (VALTREX) 1000 MG tablet Take 1,000 mg by mouth daily as needed (breakouts). Reported on 04/18/2016  . [DISCONTINUED] amoxicillin-clavulanate (AUGMENTIN) 875-125 MG tablet Take 1 tablet by mouth 2 (two) times daily.  . [DISCONTINUED] hydrocortisone-pramoxine (PROCTOFOAM HC) rectal foam Place 1 applicator rectally 4 (four) times daily. (Patient not taking: Reported on 04/18/2016)  . [DISCONTINUED] lidocaine (XYLOCAINE) 5 % ointment Apply 1 application topically as needed.  . [DISCONTINUED] Lidocaine, Anorectal, 5 % CREA Apply topically to affected area as needed.  . [DISCONTINUED] Lidocaine-Hydrocortisone Ace 3-2.5 % KIT Place 1 suppository rectally 2 (two) times daily.  . [DISCONTINUED] megestrol (MEGACE) 40 MG tablet Take 1 tablet (40 mg total) by mouth 2 (two) times daily.   No facility-administered encounter medications on file as of 04/18/2016.   Allergies  Allergen Reactions  . Codeine Rash  . Latex Rash  . Tetracyclines & Related Rash   ROS: no fever, chills, headaches, dizziness, numbness, tingling, syncope, chest pain, palpitations, nausea, vomiting, heartburn, abdominal pain, bowel changes--no melena, hematochezia, constipation or diarrhea. No urinary complaints.  No vaginal discharge.  +heavy menses as per HPI.  No easy bleeding, bruising, rashes, skin concerns.  No depression, anxiety or other concerns.  PHYSICAL EXAM: BP 120/78 mmHg  Pulse 84  Ht 5' 5"  (1.651 m)  Wt 185 lb 3.2 oz (84.006 kg)  BMI 30.82 kg/m2  LMP 03/29/2016  Well developed, pleasant female, in good spirits, in no distress HEENT: PERRL, EOMI, conjunctiva and sclera are clear.  Normal nasal mucosa and OP is  clear Neck: no lymphadenopathy, thyromegaly, mass or bruit Heart: regular rate and rhythm without murmur Lungs: clear bilaterally Back: no spinal or CVA tenderness Abdomen: soft, nontender, no organomegaly or mass. WHSS at [place].  She reports feeling a nodule/swelling when standing--no significant abnormality or hernia noted. nontender Extremities: no edema, normal pulses Skin: no bruising, normal turgor, no lesions Psych: normal mood, affect, hygiene and grooming Neuro: alert and oriented. Cranial nerves intact. DTR's 2+ and symmetric. Normal strength, gait  ASSESSMENT/PLAN:  Anemia, iron deficiency - likely due to menorrhagia; check labs; increase Fe  to BID. due for colonoscopy - Plan: CBC with Differential/Platelet, Ferritin, Iron  Menorrhagia with regular cycle - due to f/u with GYN  Other fatigue - Plan: VITAMIN D 25 Hydroxy (Vit-D Deficiency, Fractures), TSH, CBC with Differential/Platelet, Comprehensive metabolic panel  Pure hypercholesterolemia - h/o elevated, no recent screening - Plan: Lipid panel  Screen for colon cancer - Plan: Ambulatory referral to Gastroenterology  Arthritis of first MTP joint - trial glucosamine/chondroitin; tylenol and/or NSAIDs prn.    CBC, iron/ferritin, c-met, TSH, vit D, lipid She is nonfasting today--future enters ordered.  She may try and get these at the lab across from her job--written rx given also, with dx codes, in case they can't release the future orders.   Add glucosamine and chondroitin supplements to your daily regimen. See podiatrist ([place]) or orthopedist if worsening pain in your right great toe. Use tylenol if needed for pain, or anti-inflammatories if there is swelling or if tylenol isn't effective.  Continue your daily vitamin D. Increase the iron to twice daily with food/orange juice.  I will let you know any changes to these recommendations after labs are reviewed.

## 2016-04-18 NOTE — Patient Instructions (Signed)
Get fasting labs done at your convenience (this week, if possible).  Add glucosamine and chondroitin supplements to your daily regimen. See podiatrist ([place]) or orthopedist if worsening pain in your right great toe. Use tylenol if needed for pain, or anti-inflammatories if there is swelling or if tylenol isn't effective.  Continue your daily vitamin D. Increase the iron to twice daily with food/orange juice.  I will let you know any changes to these recommendations after labs are reviewed.  We are referring you to [facility] for colonoscopy--Dr. Carlean Purl is who we discussed (I forgot to put name down on the referral).  It is recommended that you get at least 30 minutes of aerobic exercise at least 5 days/week (for weight loss, you may need as much as 60-90 minutes). This can be any activity that gets your heart rate up. This can be divided in 10-15 minute intervals if needed, but try and build up your endurance at least once a week.  Weight bearing exercise is also recommended twice weekly.

## 2016-04-23 ENCOUNTER — Encounter: Payer: Self-pay | Admitting: Internal Medicine

## 2016-04-23 DIAGNOSIS — D509 Iron deficiency anemia, unspecified: Secondary | ICD-10-CM | POA: Diagnosis not present

## 2016-04-23 DIAGNOSIS — E7800 Pure hypercholesterolemia, unspecified: Secondary | ICD-10-CM | POA: Diagnosis not present

## 2016-04-23 DIAGNOSIS — R5383 Other fatigue: Secondary | ICD-10-CM | POA: Diagnosis not present

## 2016-04-25 ENCOUNTER — Encounter: Payer: Self-pay | Admitting: Family Medicine

## 2016-04-27 ENCOUNTER — Encounter: Payer: Self-pay | Admitting: Family Medicine

## 2016-06-05 ENCOUNTER — Telehealth: Payer: Self-pay | Admitting: Family Medicine

## 2016-06-05 NOTE — Telephone Encounter (Signed)
Pt requested copy of labs, sent same.

## 2016-08-31 ENCOUNTER — Other Ambulatory Visit: Payer: Self-pay | Admitting: *Deleted

## 2016-08-31 MED ORDER — HYDROCORTISONE ACE-PRAMOXINE 1-1 % RE FOAM: 1.0000 | Freq: Two times a day (BID) | RECTAL | 0 refills | Status: DC

## 2016-08-31 NOTE — Progress Notes (Signed)
Pt needed Rx called to [facility] pharmacy for Proctofoam bc [facility] pharmacy didn't have it in [place]

## 2016-10-31 DIAGNOSIS — L720 Epidermal cyst: Secondary | ICD-10-CM | POA: Diagnosis not present

## 2016-10-31 DIAGNOSIS — L570 Actinic keratosis: Secondary | ICD-10-CM | POA: Diagnosis not present

## 2017-01-25 ENCOUNTER — Encounter: Payer: Self-pay | Admitting: Family Medicine

## 2017-01-25 ENCOUNTER — Ambulatory Visit (INDEPENDENT_AMBULATORY_CARE_PROVIDER_SITE_OTHER): Payer: 59 | Admitting: Family Medicine

## 2017-01-25 ENCOUNTER — Ambulatory Visit: Payer: Self-pay

## 2017-01-25 VITALS — BP 103/49 | Ht 65.0 in | Wt 170.0 lb

## 2017-01-25 DIAGNOSIS — M25512 Pain in left shoulder: Secondary | ICD-10-CM | POA: Diagnosis not present

## 2017-01-25 MED ORDER — MELOXICAM 15 MG PO TABS: 15.0000 mg | ORAL_TABLET | Freq: Every day | ORAL | 0 refills | Status: DC

## 2017-01-25 NOTE — Progress Notes (Signed)
  Sharon Simpson - 51 y.o. female MRN [id number]  Date of birth: 08/13/1966    SUBJECTIVE:      Chief Complaint:/ HPI:   LEFT shoulder pain 2 weeks. Started with an awkward left handed closure of her car dorr. As she slammed door with arm extended behind her she felt significant pain. It improved after a few minutes but over last 2 weeks since incident she is having aching posterior shoulder pain, worse at night and wilth upright position. Rotating left are into external rotation, especially with extension behind her is very painful  Works as a Marine scientist. Right hand dominant. Daughter is a physiatrist currently completing fellowship in [employer].    ROS:     No numbness or tingling left hand or arm. No fever. No unusual weight loss. No other unusual arthralgias or myalgias. No rash.  PERTINENT  PMH / PSH FH / / SH:  Past Medical, Surgical, Social, and Family History Reviewed & Updated in the EMR.  Pertinent findings include:  No personal history of DM No prior left shoulder injuries problems or surgeries Former smoke Hx ovarian cyst (benign) OBJECTIVE: BP (!) 103/49   Ht 5\' 5"  (1.651 m)   Wt 170 lb (77.1 kg)   BMI 28.29 kg/m   Physical Exam:  Vital signs are reviewed. WD WN NAD Shoulders B symmetrical. Left shoulder has FROM. Pain with external rotation passive and active, especially of extended at elbow. Normal bicep strength. Posterior shoulder capsule area is nildy ttp. SKIN no rash ofr lesions of left shoulder area NEURO intact soft touch sensation B hands.  [place]: AC joint has some osteophytes but no effusion. All muscles of rotator cuff are see nin entirety and reveal no abnormalities.  The posterior view of the labrum reveals a distinct 2-3 mm hyper-echoic mass within the glenoid/labral area. No increased doppler activity noted in this area. Bicep tendon is intact and without abnormality  ASSESSMENT & PLAN:  See problem based charting & AVS for pt instructions.

## 2017-01-26 DIAGNOSIS — M25512 Pain in left shoulder: Secondary | ICD-10-CM | POA: Insufficient documentation

## 2017-01-26 NOTE — Assessment & Plan Note (Signed)
Discussed options. She wants to try conservative rest with gentle stretching to retain ROM. F/u in 3 weeks or so if not improving, sooner if worse. Next steps would be to consider MR Arthrogram to evaluate the abnormality seen on ultrasound or potentially GHJ CSI under ultrasound.

## 2017-02-15 ENCOUNTER — Ambulatory Visit: Payer: 59 | Admitting: Family Medicine

## 2017-02-27 ENCOUNTER — Ambulatory Visit (INDEPENDENT_AMBULATORY_CARE_PROVIDER_SITE_OTHER): Payer: 59 | Admitting: Family Medicine

## 2017-02-27 ENCOUNTER — Encounter: Payer: Self-pay | Admitting: Family Medicine

## 2017-02-27 VITALS — BP 113/60 | HR 87 | Temp 98.2°F | Resp 16 | Ht 65.0 in | Wt 184.6 lb

## 2017-02-27 DIAGNOSIS — Z1231 Encounter for screening mammogram for malignant neoplasm of breast: Secondary | ICD-10-CM | POA: Diagnosis not present

## 2017-02-27 DIAGNOSIS — E7801 Familial hypercholesterolemia: Secondary | ICD-10-CM

## 2017-02-27 DIAGNOSIS — Z1211 Encounter for screening for malignant neoplasm of colon: Secondary | ICD-10-CM

## 2017-02-27 DIAGNOSIS — Z0000 Encounter for general adult medical examination without abnormal findings: Secondary | ICD-10-CM

## 2017-02-27 DIAGNOSIS — E66811 Obesity, class 1: Secondary | ICD-10-CM

## 2017-02-27 DIAGNOSIS — E6609 Other obesity due to excess calories: Secondary | ICD-10-CM | POA: Diagnosis not present

## 2017-02-27 DIAGNOSIS — Z1239 Encounter for other screening for malignant neoplasm of breast: Secondary | ICD-10-CM

## 2017-02-27 DIAGNOSIS — Z6830 Body mass index (BMI) 30.0-30.9, adult: Secondary | ICD-10-CM

## 2017-02-27 NOTE — Patient Instructions (Addendum)
     IF you received an x-ray today, you will receive an invoice from [facility]. Please contact [facility] at [phone] with questions or concerns regarding your invoice.   IF you received labwork today, you will receive an invoice from [facility]. Please contact [facility] at [phone] with questions or concerns regarding your invoice.   Our billing staff will not be able to assist you with questions regarding bills from these companies.  You will be contacted with the lab results as soon as they are available. The fastest way to get your results is to activate your My Chart account. Instructions are located on the last page of this paperwork. If you have not heard from us regarding the results in 2 weeks, please contact this office.    We recommend that you schedule a mammogram for breast cancer screening. Typically, you do not need a referral to do this. Please contact a local imaging center to schedule your mammogram.  [facility] - [phone]  *ask for the Radiology Department [facility] - [phone] or [phone]  [facility] - [phone] [facility] - [phone] [facility] - [phone]  *ask for the Radiology Department [facility] - [phone]  *ask for the Radiology Department [facility] - [phone]  *ask for the Mammography Department [facility] - [phone] 

## 2017-02-27 NOTE — Progress Notes (Signed)
Chief Complaint  Patient presents with  . Annual Exam    no pap    Subjective:  Sharon Simpson is a 51 y.o. female here for a health maintenance visit.  Patient is new pt    Patient Active Problem List   Diagnosis Date Noted  . Left shoulder pain 01/26/2017  . Ovarian cyst, right 04/18/2015  . Anemia 02/19/2014  . Fibroid uterus 02/19/2014  . Endometrial polyp 02/19/2014  . DUB (dysfunctional uterine bleeding) 02/08/2014  . Light headed 02/08/2014  . Weight gain 02/08/2014  . Arthritis of first MTP joint 04/28/2012  . Calcaneal bursitis (heel), left 04/28/2012  . LUMBAR RADICULOPATHY, LEFT 01/25/2009  . TROCHANTERIC BURSITIS, LEFT 01/25/2009    Past Medical History:  Diagnosis Date  . Arthritis of foot    R first MTP (xray 04/2012)  . Herpes labialis    fever blisters when in the sun  . High cholesterol    per pt--improved with diet/exercise  . Iron deficiency anemia   . Low back pain    sees Dr. Lynann Bologna  . Menorrhagia    Dr. Toney Rakes  . Ovarian cyst    (surgery age 44)    Past Surgical History:  Procedure Laterality Date  . APPENDECTOMY     51 YR OLD  . DILATATION & CURETTAGE/HYSTEROSCOPY WITH TRUECLEAR N/A 03/19/2014   Procedure: DILATATION & CURETTAGE/HYSTEROSCOPY WITH TRUCLEAR;  Surgeon: Terrance Mass, MD;  Location: [facility];  Service: Gynecology;  Laterality: N/A;  Resectoscopic Polypectomy   . pyloric stenosis repair  infant  . WRIST GANGLION EXCISION       Outpatient Medications Prior to Visit  Medication Sig Dispense Refill  . meloxicam (MOBIC) 15 MG tablet Take 1 tablet (15 mg total) by mouth daily. 30 tablet 0  . Multiple Vitamins-Minerals (MULTIVITAMIN WITH MINERALS) tablet Take 1 tablet by mouth daily.    . valACYclovir (VALTREX) 1000 MG tablet Take 1,000 mg by mouth daily as needed (breakouts). Reported on 04/18/2016    . vitamin B-12 (CYANOCOBALAMIN) 1000 MCG tablet Take 1,000 mcg by mouth daily.    . cholecalciferol (VITAMIN D) 1000  units tablet Take 1,000 Units by mouth daily.    . Ferrous Sulfate (IRON) 325 (65 Fe) MG TABS Take 65 mg by mouth daily.    . hydrocortisone-pramoxine (PROCTOFOAM HC) rectal foam Place 1 applicator rectally 2 (two) times daily. 10 g 0   No facility-administered medications prior to visit.     Allergies  Allergen Reactions  . Codeine Rash  . Latex Rash  . Tetracyclines & Related Rash     Family History  Problem Relation Age of Onset  . Rheum arthritis Mother   . Arthritis Mother   . COPD Mother   . Asthma Mother     child  . Alcohol abuse Father   . Heart disease Father 69  . Hypertension Father   . Irritable bowel syndrome Sister   . Bipolar disorder Sister   . Alcohol abuse Sister   . Colon polyps Sister     teenager  . Asthma Brother   . Mental illness Maternal Aunt     fixed delusions  . Hyperlipidemia Maternal Uncle   . Hyperlipidemia Maternal Grandmother   . Heart disease Maternal Grandmother     MI  . Stroke Maternal Grandmother   . Heart disease Paternal Grandmother 73    MI  . Cancer Paternal Grandfather     lung cancer  . Diabetes Neg Hx  Health Habits: Dental Exam:  up to date Eye Exam: not up to date Exercise: 0 times/week on average Current exercise activities: walking/running Diet:   Social History   Social History  . Marital status: Married    Spouse name: N/A  . Number of children: 3  . Years of education: N/A   Occupational History  . 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 Smoking status: Former Smoker    Packs/day: 1.00    Years: 8.00    Types: Cigarettes  . Smokeless tobacco: Never Used     Comment: quit age 12 (from age [age], 57 PPD)  . Alcohol use No  . Drug use: No  . Sexual activity: Yes    Partners: Male     Comment: VASECTOMY   Other Topics Concern  . Not on file   Social History Narrative   Married.   Daughter is 3 rd physiatry resident in [place]    Son lives in [place], [place] grandchild    Youngest son is at [employer]   History  Alcohol Use No   History  Smoking Status  . Former Smoker  . Packs/day: 1.00  . Years: 8.00  . Types: Cigarettes  Smokeless Tobacco  . Never Used    Comment: quit age 74 (from age [age], 1 PPD)   History  Drug Use No    GYN: Sexual Health Menstrual status: regular menses LMP: Patient's last menstrual period was 01/30/2017 (approximate). Last pap smear: see HM section History of abnormal pap smears:  Sexually active: ** with ** partner Current contraception:   Health Maintenance: See under health Maintenance activity for review of completion dates as well. Immunization History  Administered Date(s) Administered  . Influenza Split 08/30/2011, 08/24/2014  . Influenza-Unspecified 08/10/2016      Depression Screen-PHQ2/9 Depression screen [facility] 2/9 02/27/2017 01/25/2017  Decreased Interest 0 0  Down, Depressed, Hopeless 0 0  PHQ - 2 Score 0 0      Depression Severity and Treatment Recommendations:  0-4= None  5-9= Mild / Treatment: Support, educate to call if worse; return in one month  10-14= Moderate / Treatment: Support, watchful waiting; Antidepressant or Psycotherapy  15-19= Moderately severe / Treatment: Antidepressant OR Psychotherapy  >= 20 = Major depression, severe / Antidepressant AND Psychotherapy    Review of Systems   ROS  See HPI for ROS as well.    Objective:   Vitals:   02/27/17 1616  BP: 113/60  Pulse: 87  Resp: 16  Temp: 98.2 F (36.8 C)  TempSrc: Oral  SpO2: 98%  Weight: 184 lb 9.6 oz (83.7 kg)  Height: 5\' 5"  (1.651 m)    Body mass index is 30.72 kg/m.  Physical Exam  Constitutional: She is oriented to person, place, and time. She appears well-developed and well-nourished.  HENT:  Head: Normocephalic and atraumatic.  Right Ear: External ear normal.  Left Ear: External ear normal.  Nose: Nose normal.  Mouth/Throat: Oropharynx is clear and moist.   Eyes: Conjunctivae and EOM are normal. Right eye exhibits no discharge. Left eye exhibits no discharge.  Neck: Normal range of motion. Neck supple. No thyromegaly present.  Cardiovascular: Normal rate, regular rhythm, normal heart sounds and intact distal pulses.   No murmur heard. Pulmonary/Chest: Effort normal and breath sounds normal. No respiratory distress. She has no wheezes. She has no rales.  Abdominal: Soft. Bowel sounds are normal. She exhibits no distension. There is  no tenderness. There is no rebound and no guarding.  Musculoskeletal: Normal range of motion. She exhibits no edema, tenderness or deformity.  Neurological: She is alert and oriented to person, place, and time. She has normal reflexes. No cranial nerve deficit.  Skin: Skin is warm. No erythema.  Psychiatric: She has a normal mood and affect. Her behavior is normal. Judgment and thought content normal.       Assessment/Plan:   Patient was seen for a health maintenance exam.  Counseled the patient on health maintenance issues. Reviewed her health mainteance schedule and ordered appropriate tests (see orders.) Counseled on regular exercise and weight management. Recommend regular eye exams and dental cleaning.   The following issues were addressed today for health maintenance:   Lauralynn was seen today for annual exam.  Diagnoses and all orders for this visit:  Encounter for health maintenance examination in adult -     HM COLONOSCOPY; Future -     MM DIGITAL SCREENING BILATERAL; Future -     Lipid panel -     CBC with Differential/Platelet -     TSH -     Hemoglobin A1c  Screening for breast cancer- pt performs breast exams at home Referral to mammogram done -     MM DIGITAL SCREENING BILATERAL; Future  Screening for colon cancer -     HM COLONOSCOPY; Future  Class 1 obesity due to excess calories without serious comorbidity with body mass index (BMI) of 30.0 to 30.9 in adult- encourage and empowered pt  to move more   Sees Gynecology for women's health  Discussed mammogram screening and colonoscopy   No Follow-up on file.    Body mass index is 30.72 kg/m.:  Discussed the patient's BMI with patient. The BMI body mass index is 30.72 kg/m.     No future appointments.  Patient Instructions       IF you received an x-ray today, you will receive an invoice from [facility]. Please contact [facility] at [phone] with questions or concerns regarding your invoice.   IF you received labwork today, you will receive an invoice from [facility]. Please contact [facility] at [phone] with questions or concerns regarding your invoice.   Our billing staff will not be able to assist you with questions regarding bills from these companies.  You will be contacted with the lab results as soon as they are available. The fastest way to get your results is to activate your My Chart account. Instructions are located on the last page of this paperwork. If you have not heard from [place] regarding the results in 2 weeks, please contact this office.    We recommend that you schedule a mammogram for breast cancer screening. Typically, you do not need a referral to do this. Please contact a local imaging center to schedule your mammogram.  [facility] - [phone]  *ask for the Radiology Department [facility] - [phone] or [phone]  [facility] - [phone] [place] [phone] [facility] - [phone]  *ask for the [facility] - [phone]  *ask for the Radiology Department [facility] - [phone]  *ask for the [facility] - [phone]

## 2017-02-28 LAB — LIPID PANEL
CHOL/HDL RATIO: 4.9 ratio — AB (ref 0.0–4.4)
Cholesterol, Total: 209 mg/dL — ABNORMAL HIGH (ref 100–199)
HDL: 43 mg/dL (ref >39)
LDL Calculated: 120 mg/dL — ABNORMAL HIGH (ref 0–99)
Triglycerides: 232 mg/dL — ABNORMAL HIGH (ref 0–149)
VLDL Cholesterol Cal: 46 mg/dL — ABNORMAL HIGH (ref 5–40)

## 2017-02-28 LAB — CBC WITH DIFFERENTIAL/PLATELET
BASOS ABS: 0 10*3/uL (ref 0.0–0.2)
Basos: 1 %
EOS (ABSOLUTE): 0.1 10*3/uL (ref 0.0–0.4)
Eos: 2 %
HEMOGLOBIN: 10.1 g/dL — AB (ref 11.1–15.9)
Hematocrit: 33.3 % — ABNORMAL LOW (ref 34.0–46.6)
IMMATURE GRANS (ABS): 0 10*3/uL (ref 0.0–0.1)
IMMATURE GRANULOCYTES: 0 %
LYMPHS ABS: 2.1 10*3/uL (ref 0.7–3.1)
LYMPHS: 27 %
MCH: 21.7 pg — ABNORMAL LOW (ref 26.6–33.0)
MCHC: 30.3 g/dL — AB (ref 31.5–35.7)
MCV: 72 fL — ABNORMAL LOW (ref 79–97)
Monocytes Absolute: 0.6 10*3/uL (ref 0.1–0.9)
Monocytes: 8 %
NEUTROS PCT: 62 %
Neutrophils Absolute: 4.7 10*3/uL (ref 1.4–7.0)
Platelets: 368 10*3/uL (ref 150–379)
RBC: 4.66 x10E6/uL (ref 3.77–5.28)
RDW: 18.6 % — ABNORMAL HIGH (ref 12.3–15.4)
WBC: 7.5 10*3/uL (ref 3.4–10.8)

## 2017-02-28 LAB — HEMOGLOBIN A1C
ESTIMATED AVERAGE GLUCOSE: 108 mg/dL
Hgb A1c MFr Bld: 5.4 % (ref 4.8–5.6)

## 2017-02-28 LAB — TSH: TSH: 2.64 u[IU]/mL (ref 0.450–4.500)

## 2017-03-11 ENCOUNTER — Encounter: Payer: Self-pay | Admitting: Sports Medicine

## 2017-03-11 ENCOUNTER — Ambulatory Visit (INDEPENDENT_AMBULATORY_CARE_PROVIDER_SITE_OTHER): Payer: 59 | Admitting: Sports Medicine

## 2017-03-11 VITALS — BP 100/35 | Ht 65.0 in | Wt 180.0 lb

## 2017-03-11 DIAGNOSIS — M545 Low back pain, unspecified: Secondary | ICD-10-CM

## 2017-03-11 DIAGNOSIS — S332XXA Dislocation of sacroiliac and sacrococcygeal joint, initial encounter: Secondary | ICD-10-CM

## 2017-03-11 DIAGNOSIS — M549 Dorsalgia, unspecified: Secondary | ICD-10-CM | POA: Insufficient documentation

## 2017-03-11 NOTE — Progress Notes (Addendum)
[facility] Progress Note  Subjective:  Sharon Simpson is a 51 y.o. female with history of low back pain and fibromyalgia who presents with new onset L lower back/hip pain.  Symptoms began acutely Friday as she lifted her left leg to put on pants after she had bathed her 55-month-old grandchild and had taken a shower herself. Saturday she was even more sore, worse with turning. She got a massage, hoping this would improve pain but thinks this only led to some bruising of her L buttocks. She says she has been walking okay but has acute pain when twisting her torso, getting out of bed, or getting out of seated position. Today, a work Social worker had to help her get out of her car because it was too painful to shift her legs. She denies radiation down her leg or beyond her L lower back. Has only taken about 1 ibuprofen, as does not like taking medication.   Of note, patient has had iliosacral joint pain in the past (2011). At that time, symptoms were less intense and resolved with OMM performed by patient's daughter who was in medical school at the time and after massage. MR lumbar spine at the time showed L4-5 facet disease.   ROS: No bowel or bladder incontinence/ no sciatica  Social: Former smoker  Objective: Blood pressure (!) 100/35, height 5\' 5"  (1.651 m), weight 180 lb (81.6 kg). Body mass index is 29.95 kg/m. Constitutional: Pleasant, overweight female in mild distress Musculoskeletal: No midline spinal tenderness to palpation and able to tolerate passive leg extension without pain. Positive FABER test (R>L). R hip flexion brought on L lower back pain. Mild discomfort with internal and external rotation of L hip but no TTP over L greater trochanter. TTP over L sacroiliac joint.  Skin: Skin is warm and dry. No rash noted.  Psychiatric: Somewhat anxious affect  Vitals reviewed  Assessment/Plan: Back pain - Acute left lower back pain consistent with SI joint pain on exam -  Pretzel stretch and hip external rotation exercises reviewed, with some relief reported by patient - Dr. Oneida Alar also recommended heat for pain relief - Steroid shot offered but patient preferred to try exercises first at this time   Follow-up if no improvement.  Olene Floss, MD [place], PGY-2  I observed and examined the patient with the resident and agree with assessment and plan.  Note reviewed and modified by me. Stefanie Libel, MD

## 2017-03-11 NOTE — Assessment & Plan Note (Signed)
-   Acute left lower back pain consistent with SI joint pain on exam - Pretzel stretch and hip external rotation exercises reviewed, with some relief reported by patient - Dr. Oneida Alar also recommended heat for pain relief - Steroid shot offered but patient preferred to try exercises first at this time

## 2017-03-21 DIAGNOSIS — L821 Other seborrheic keratosis: Secondary | ICD-10-CM | POA: Diagnosis not present

## 2017-04-08 ENCOUNTER — Encounter: Payer: Self-pay | Admitting: Family Medicine

## 2017-04-10 ENCOUNTER — Encounter: Payer: Self-pay | Admitting: Gastroenterology

## 2017-04-14 ENCOUNTER — Encounter: Payer: Self-pay | Admitting: Family Medicine

## 2017-04-23 ENCOUNTER — Telehealth: Payer: Self-pay | Admitting: *Deleted

## 2017-04-23 DIAGNOSIS — N921 Excessive and frequent menstruation with irregular cycle: Secondary | ICD-10-CM

## 2017-04-23 DIAGNOSIS — N938 Other specified abnormal uterine and vaginal bleeding: Secondary | ICD-10-CM

## 2017-04-23 MED ORDER — MEGESTROL ACETATE 40 MG PO TABS: 40.0000 mg | ORAL_TABLET | Freq: Four times a day (QID) | ORAL | 1 refills | Status: DC

## 2017-04-23 NOTE — Telephone Encounter (Signed)
Pt informed. Rx sent. SHGM order placed and scheduled. KW CMA

## 2017-04-23 NOTE — Telephone Encounter (Signed)
Call in prescription refill for Megace 40 mg twice a day to cover her for 2 weeks. Scheduled sonohysterogram and endometrial biopsy and then we'll discuss surgery at that time

## 2017-04-23 NOTE — Telephone Encounter (Signed)
Pt having very heavy bleeding. HGB at 10 with her PCP. Last saw you in 04/2015 - showed myoma with polypectomy in 03/2014.  Discussed hysterectomy then wants to proceed now. Pt had some Megace left took it a couple weeks ago, it stopped the bleeding but this period is extremely heavy. Pt is asking if she can have refill on Megace and schedule SHGM now with CE at Preop exam? Is this ok? Sharrie Rothman, CMA

## 2017-04-24 ENCOUNTER — Encounter: Payer: Self-pay | Admitting: Gynecology

## 2017-04-29 ENCOUNTER — Ambulatory Visit (INDEPENDENT_AMBULATORY_CARE_PROVIDER_SITE_OTHER): Payer: 59 | Admitting: Gynecology

## 2017-04-29 ENCOUNTER — Other Ambulatory Visit: Payer: 59

## 2017-04-29 ENCOUNTER — Ambulatory Visit (INDEPENDENT_AMBULATORY_CARE_PROVIDER_SITE_OTHER): Payer: 59

## 2017-04-29 ENCOUNTER — Encounter: Payer: Self-pay | Admitting: Gynecology

## 2017-04-29 ENCOUNTER — Other Ambulatory Visit: Payer: Self-pay | Admitting: Gynecology

## 2017-04-29 DIAGNOSIS — D252 Subserosal leiomyoma of uterus: Secondary | ICD-10-CM

## 2017-04-29 DIAGNOSIS — N939 Abnormal uterine and vaginal bleeding, unspecified: Secondary | ICD-10-CM

## 2017-04-29 DIAGNOSIS — N83201 Unspecified ovarian cyst, right side: Secondary | ICD-10-CM

## 2017-04-29 DIAGNOSIS — N921 Excessive and frequent menstruation with irregular cycle: Secondary | ICD-10-CM

## 2017-04-29 DIAGNOSIS — N840 Polyp of corpus uteri: Secondary | ICD-10-CM | POA: Diagnosis not present

## 2017-04-29 DIAGNOSIS — N831 Corpus luteum cyst of ovary, unspecified side: Secondary | ICD-10-CM

## 2017-04-29 DIAGNOSIS — N938 Other specified abnormal uterine and vaginal bleeding: Secondary | ICD-10-CM

## 2017-04-29 DIAGNOSIS — D500 Iron deficiency anemia secondary to blood loss (chronic): Secondary | ICD-10-CM

## 2017-04-29 DIAGNOSIS — D251 Intramural leiomyoma of uterus: Secondary | ICD-10-CM

## 2017-04-29 LAB — CBC WITH DIFFERENTIAL/PLATELET
Basophils Absolute: 76 cells/uL (ref 0–200)
Basophils Relative: 1 %
EOS PCT: 2 %
Eosinophils Absolute: 152 cells/uL (ref 15–500)
HCT: 32.3 % — ABNORMAL LOW (ref 35.0–45.0)
Hemoglobin: 9.6 g/dL — ABNORMAL LOW (ref 11.7–15.5)
LYMPHS ABS: 2356 {cells}/uL (ref 850–3900)
LYMPHS PCT: 31 %
MCH: 23.8 pg — ABNORMAL LOW (ref 27.0–33.0)
MCHC: 29.7 g/dL — AB (ref 32.0–36.0)
MCV: 80.1 fL (ref 80.0–100.0)
MONOS PCT: 8 %
MPV: 8 fL (ref 7.5–12.5)
Monocytes Absolute: 608 cells/uL (ref 200–950)
NEUTROS PCT: 58 %
Neutro Abs: 4408 cells/uL (ref 1500–7800)
PLATELETS: 413 10*3/uL — AB (ref 140–400)
RBC: 4.03 MIL/uL (ref 3.80–5.10)
RDW: 18.8 % — AB (ref 11.0–15.0)
WBC: 7.6 10*3/uL (ref 3.8–10.8)

## 2017-04-29 NOTE — Addendum Note (Signed)
Addended by: Terrance Mass on: 04/29/2017 10:43 AM   Modules accepted: Orders

## 2017-04-29 NOTE — Progress Notes (Signed)
   Patient is a 51 year old has not been seen since 2016 patient has had history of dysfunctional uterine bleeding back then and she's been having recently. She has only been seen her PCP. Patient had an ultrasound back in May 2016 as well as endometrial biopsy and sonohysterogram with the following findings at that time:  "Uterus measured 8.5 x 7.9 x 6.2 cm with endometrial stripe of 18 mm (last menstrual cycle 13 days ago) a subserous myoma measuring 44 x 40 x 45 mm was noted and was described as having decreased in size from previous ultrasound. Right ovary had a thinwall primarily echo-free cyst measuring 39 x 33 x 37 mm her size 3.6 cm with a small calcification and wall measuring 3.7 mm. Arterial blood flow of the ovary was noted left ovary was normal. No fluid in the cul-de-sac. Afterwards the surgeon's cleansed with Betadine solution and a sterile catheter was introduced into the uterine cavity normal saline was instilled. There was no defect noted in the uterine cavity. Following this the cervix was cleansed with Betadine solution once again and a sterile Pipelle was introduced into the uterine cavity and endometrial biopsy was obtained and tissue submitted for histological evaluation."  Pathology report: Diagnosis Endometrium, biopsy - POLYPOID DYSSYNCHRONOUS SECRETORY ENDOMETRIUM WITH MIXED PATTERN OF EARLY SECRETORY ENDOMETRIUM AND AREAS OF GLAND ATROPHY WITH STROMAL DECIDUALIZATION. - NO ENDOMETRIAL HYPERPLASIA, ATYPIA, OR MALIGNANCY IDENTIFIED  Patient had called the office because of her irregular heavy bleeding and she was called in Megace 40 mg to take for which now she's been taking one 3 times a day. Also patient stated she had a CBC done by her primary care physician on March 21 and the report was her value was 10.1 and she's currently taking iron supplementation. She is here for sonohysterogram and endometrial biopsy. She had a normal Pap smear in 2015.  Ultrasound: Uterus  measured 9.0 x 8.0 x 6.1 cm endometrial stripe of 8.7 mm. The left subserous fibroid measuring 51 x 49 mm and an intramural myoma measuring 24 x 22 mm. She also had a thinwall avascular right ovarian cyst measuring 27 x 25 mm. An 8 collapse left ovarian or per sling cyst measuring 15 x 9 mm. Afterwards the cervix was cleansed with Betadine solution and a sterile catheter was introduced into the uterine cavity and at defect measuring 11 x 14 x 9 mm was noted. Following this a sterile Pipelle was introduced into the uterine cavity and tissue obtained submitted for histological evaluation.  Review of her record indicated in 2015 she had a resectoscopic polypectomy.  Assessment/plan: Patient with enlarging symptomatic leiomyomatous uteri and endometrial polyp prior history in 2015 a resectoscopic polypectomy. Patient with evidence of iron deficiency anemia based on CBC done by PCP a few months ago. Were going to check her CBC today and encourage her to take one iron tablet twice a day and to continue to take her Megace 2-3 times a day to control her bleeding. We are going to make arrangements to schedule a total abdominal hysterectomy with bilateral salpingo-oophorectomy. We'll see her 1 week before surgery for preop. Literature information was provided. Pathology report pending.

## 2017-04-30 ENCOUNTER — Telehealth: Payer: Self-pay

## 2017-04-30 NOTE — Telephone Encounter (Signed)
I spoke with patient and let her know per Dr. Moshe Salisbury that hemoglobin too low for surgery. Not enough time before 6/5 to get it up enough to safely do her surgery.  Dr. Moshe Salisbury offered her to do D&C hysteroscopy to "clean out uterus" before her trip and she could continue her iron and Megace to get her hgb up before surgery with one of his associates.  Patient declined this saying the Megace is working. No bleeding today.  She does want to proceed with Dr. Loetta Rough scheduling surgery, hopefully for 06/11/17.  I went ahead and scheduled surgery with TF on 06/11/17 to hold  the time. She will schedule appointment to come see Dr. Loetta Rough in the next week or two to allow him to review her case and make recommendations as he sees fit.

## 2017-05-01 NOTE — Telephone Encounter (Signed)
If she is talking hysterectomy I am not sure I am comfortable rushing to this when she has what looks like a polyp per Dr Durenda Guthrie description on ultrasound and a D & C would be a more appropriate choice particularly in a 51 year old that may be going through menopause shortly.  DepoLupron pre op would also be recommended for Hb recovery.  She may want to revisit this with Dr Moshe Salisbury.

## 2017-05-01 NOTE — Telephone Encounter (Signed)
Patient informed. She said this is her 2nd polyp and she "has been dealing with this for 4 years". She said she would really like to come and talk with you.

## 2017-05-03 NOTE — Telephone Encounter (Signed)
Okay 

## 2017-05-07 ENCOUNTER — Encounter: Payer: Self-pay | Admitting: Gynecology

## 2017-05-07 ENCOUNTER — Ambulatory Visit (INDEPENDENT_AMBULATORY_CARE_PROVIDER_SITE_OTHER): Payer: 59 | Admitting: Gynecology

## 2017-05-07 VITALS — BP 120/76

## 2017-05-07 DIAGNOSIS — N921 Excessive and frequent menstruation with irregular cycle: Secondary | ICD-10-CM

## 2017-05-07 DIAGNOSIS — D259 Leiomyoma of uterus, unspecified: Secondary | ICD-10-CM

## 2017-05-07 LAB — CBC WITH DIFFERENTIAL/PLATELET
Basophils Absolute: 65 cells/uL (ref 0–200)
Basophils Relative: 1 %
Eosinophils Absolute: 130 cells/uL (ref 15–500)
Eosinophils Relative: 2 %
HEMATOCRIT: 35.3 % (ref 35.0–45.0)
HEMOGLOBIN: 10.6 g/dL — AB (ref 11.7–15.5)
LYMPHS ABS: 2015 {cells}/uL (ref 850–3900)
Lymphocytes Relative: 31 %
MCH: 24.2 pg — ABNORMAL LOW (ref 27.0–33.0)
MCHC: 30 g/dL — AB (ref 32.0–36.0)
MCV: 80.6 fL (ref 80.0–100.0)
MONO ABS: 455 {cells}/uL (ref 200–950)
MPV: 8.3 fL (ref 7.5–12.5)
Monocytes Relative: 7 %
NEUTROS PCT: 59 %
Neutro Abs: 3835 cells/uL (ref 1500–7800)
Platelets: 418 10*3/uL — ABNORMAL HIGH (ref 140–400)
RBC: 4.38 MIL/uL (ref 3.80–5.10)
RDW: 18.3 % — ABNORMAL HIGH (ref 11.0–15.0)
WBC: 6.5 10*3/uL (ref 3.8–10.8)

## 2017-05-07 NOTE — Progress Notes (Signed)
Sharon Simpson Jun 04, 1966 [id number]        50 y.o.  G3P3003 presents in follow up having recently seen Dr. Toney Simpson due to heavy monthly menses. She was placed on Megace which slowed her bleeding and presented to see him 04/29/2017 where a sonohysterogram was performed showing a 51 mm and a 24 mm leiomyoma, a thin-walled avascular right ovarian cyst measuring 15 x 9 mm and after fluid injection a endometrial defect consistent with polyp measuring 11 x 14 x 9 mm. Endometrial biopsy showed a benign endometrial polyp. Options for management were reviewed with her with initial decision to proceed with a TAH. Hemoglobin performed was 9 and she now currently is taking iron twice daily and staying on her Megace without further bleeding. At that point Dr. Toney Simpson decided to abort the abdominal hysterectomy and she was redirected to see me for treatment option discussion. She does have a history of leiomyoma in the past previously measured at 45 mm 2 years ago. Also has a history of endometrial polyp resection 2016 secondary to heavy bleeding and polyp. She notes that the bleeding did not get better after the polypectomy and she continues to have heavy 5 to seven-day flows with bleedthrough episodes. She is not having symptoms to suggest menopause such as hot flushes night sweats.  Past medical history,surgical history, problem list, medications, allergies, family history and social history were all reviewed and documented in the EPIC chart.  Directed ROS with pertinent positives and negatives documented in the history of present illness/assessment and plan.  Exam: Sharon Simpson assistant Vitals:   05/07/17 0938  BP: 120/76   General appearance:  Normal Abdomen soft nontender without masses guarding rebound Pelvic external BUS vagina normal. Cervix normal. Uterus bulky midline mobile. Adnexa without gross masses or tenderness.  Assessment/Plan:  51 y.o. [id number] with history as above. Patient  having heavy menses over the past several years to include bleedthrough episodes despite double protection. Prior polypectomy 2 years ago without affecting her bleeding. Now with recurrent polyp and myomas although second smaller myoma appears this ultrasound the other larger myoma slightly enlarged from 45 to 51 mm. Reviewed options for management with the patient to include aggressive suppression of her menses to continue progesterone only, possible Mirena IUD, endometrial ablation, Depo-Lupron suppression. She is 50 and the issue of suppressing her menses now and hopefully she will go through menopause over the next year or so and avoid a larger procedure was discussed. Surgical interventions to include repeat hysteroscopy now with removal of the polyp, myomectomy, attempted LAVH with realistic understanding the uterus is bulky and conversion to a TAH at any point is realistic or proceeding with TAH in the first place. She also has the polyp and if a conservative approach would be chosen such as the progesterone or Depo-Lupron suppression then reevaluation some point in the future also recommended to assure polyp is not persisting. Will check baseline hemoglobin today recognizing it's been a little over week since she was checked an [facility] as a baseline to see where we stand. I did discuss if she proceeds with surgery the ovarian conservation issue particularly at age 38. Evidence that there may be ongoing ovarian hormone production even into the menopause which may help with transition versus the risks of keeping her ovaries to include benign and ovarian cancer conditions in the future. Also if she keeps her ovaries no guarantees that in the near future she would require HRT regardless for symptoms. I did discuss  HRT with her to include the various studies and risks to include breast cancer and thrombosis risks such as stroke heart attack DVT. Possible benefits particularly when started early to include bone health  and cardiovascular health was also discussed. Patient is going to follow up on her lab results and then we'll go from there. She does use vasectomy for contraception. Pap smear with HPV was negative 2015.   Sharon Auerbach MD, 10:18 AM 05/07/2017

## 2017-05-07 NOTE — Patient Instructions (Signed)
Office will call you with lab results. We will follow up with your decision as far as treatment plan

## 2017-05-08 ENCOUNTER — Telehealth: Payer: Self-pay

## 2017-05-08 LAB — FOLLICLE STIMULATING HORMONE: FSH: 8.2 m[IU]/mL

## 2017-05-08 NOTE — Telephone Encounter (Signed)
-----   Message from Anastasio Auerbach, MD sent at 05/08/2017  9:27 AM EDT ----- Tell patient her hormone level was normal and her blood count was 10.6. Her blood count was 9.69 days ago. Stay on the extra iron twice daily. Stay on the progesterone. We talked about many options and she is going to make her decision.

## 2017-05-08 NOTE — Telephone Encounter (Signed)
Patient said she definitely wants to proceed with hysterectomy and would like to try LAVH and convert to TAH if needed. Please send me your surgery slip. Thanks

## 2017-05-22 ENCOUNTER — Encounter: Payer: Self-pay | Admitting: Gynecology

## 2017-05-24 ENCOUNTER — Telehealth: Payer: Self-pay | Admitting: *Deleted

## 2017-05-24 NOTE — Telephone Encounter (Signed)
Phone call to patient re: upcoming colonoscopy scheduled 06/17/17, per record review pt is scheduled for laparoscopic robotic assisted hysterectomy on 06/11/17. Discussed with Dr. Havery Moros and he feels patient needs to wait for healing s/p hysterectomy before proceeding with colonoscopy.   No answer on cell phone, left message to call back to [phone].

## 2017-05-27 NOTE — Telephone Encounter (Signed)
Called patient no answer. Left message for patient to call us back to rescheduled.

## 2017-05-30 ENCOUNTER — Telehealth: Payer: Self-pay

## 2017-05-30 NOTE — Telephone Encounter (Signed)
Numerous attempts were made to reach patient regarding her colonoscopy appointment that is scheduled for 06/17/17. Patient is scheduled for surgery on 06/11/17 and colonoscopy should be cancelled until Jacalyn has completely healed per Dr. Havery Moros..Left message for patient to call [phone].    Riki Sheer, LPN

## 2017-06-03 ENCOUNTER — Ambulatory Visit (INDEPENDENT_AMBULATORY_CARE_PROVIDER_SITE_OTHER): Payer: 59 | Admitting: Gynecology

## 2017-06-03 ENCOUNTER — Encounter: Payer: Self-pay | Admitting: Gynecology

## 2017-06-03 VITALS — BP 114/70

## 2017-06-03 DIAGNOSIS — N840 Polyp of corpus uteri: Secondary | ICD-10-CM | POA: Diagnosis not present

## 2017-06-03 DIAGNOSIS — D259 Leiomyoma of uterus, unspecified: Secondary | ICD-10-CM | POA: Diagnosis not present

## 2017-06-03 DIAGNOSIS — N920 Excessive and frequent menstruation with regular cycle: Secondary | ICD-10-CM | POA: Diagnosis not present

## 2017-06-03 NOTE — Progress Notes (Signed)
Sharon Simpson February 01, 1966 [id number]   History and Physical  Chief complaint: Menorrhagia, iron deficiency anemia, leiomyoma, endometrial polyps  History of present illness: 51 y.o. G3P3003 with long history of heavy menses including bleedthrough episodes and double protection. Underwent hysteroscopic polypectomy 2016 but unfortunately continued to bleed heavily since then. Subsequent sonohysterogram 2018 showed a 52 mm and a 24 mm leiomyoma. Also an area consistent with endometrial polyp. Endometrial biopsy was consistent with benign endometrial polyp. Her hemoglobin had been running in the 9 range although recently is 10.6 after iron supplementation.  Has been using Megace for menstrual suppression. Options for ongoing management of her menorrhagia was discussed.  More aggressive hormonal manipulation, Mirena IUD, endometrial ablation, Depo-Lupron suppression were reviewed with her. Possible repeat hysteroscopy with removal of the current endometrial polyp was also discussed. Recent FSH was 8. Patient ultimately has decided to proceed with hysterectomy as definitive treatment for her years of heavy bleeding. Has also decided to proceed with BSO. Reviewed various routes to include attempted [facility], LAVH, robotic-assisted, TAH. Ultimately has decided to proceed with attempt at LAVH BSO with TAH if unsuccessful.  Past medical history,surgical history, medications, allergies, family history and social history were all reviewed and documented in the EPIC chart.  ROS:  Was performed and pertinent positives and negatives are included in the history of present illness.  Exam:  [name] assistant Vitals:   06/03/17 1439  BP: 114/70   General: well developed, well nourished female, no acute distress HEENT: normal  Lungs: clear to auscultation without wheezing, rales or rhonchi  Cardiac: regular rate without rubs, murmurs or gallops  Abdomen: soft, nontender without masses, guarding, rebound,  organomegaly  Pelvic: external bus vagina: normal   Cervix: grossly normal  Uterus: Bulky, midline mobile nontender Adnexa: without masses or tenderness    Assessment/Plan:  51 y.o. G8T1572 with years of heavy menorrhagia to include double protection and bleedthrough episodes. Underwent hysteroscopic resection of endometrial polyp 2 years ago with unchanging her pattern. Most recent ultrasound shows leiomyoma and endometrial polyp. Patient wants to proceed with definitive treatment to include attempted LAVH BSO. She understands that at any time during the procedure I may convert to a TAH with a larger incision and a longer recovery period. Currently using vasectomy but she also understands hysterectomy is absolute and irreversible sterilization. Sexuality following hysterectomy was also reviewed and the potential for persistent dyspareunia and persistent orgasmic dysfunction was also discussed understood and accepted. The ovarian conservation issue was again reviewed with her as it was 05/07/2017. She has decided that she wants to proceed with bilateral salpingo-oophorectomies understanding that this will remove estrogen production and that we may want to consider HRT following. I discussed the issues of HRT to include the WHI study and the most recent NAMS 2017 guidelines. Risks to include thrombosis such as stroke heart attack DVT, the breast cancer issue with HRT and the potential benefits when started early to include symptom relief and cardiovascular/bone health was all discussed. We will readdress this postoperatively but at this point she definitely wants both ovaries removed.  The expected intraoperative and postoperative courses as well as the recovery period were reviewed. The risks of infection, prolonged antibiotics, reoperation for abscess or hematoma formation was discussed. The risks of hemorrhage necessitating transfusion and the risks of transfusion reaction, hepatitis, HIV, mad cow disease  and other unknown entities was also discussed. Incisional complications to include opening and draining of incisions and closure by secondary intention, dehiscence and long-term issues of  keloid/cosmetics and hernia formation were reviewed. The risk of inadvertent injury to internal organs including bowel, bladder, ureters, vessels, nerves either immediately recognized or delay recognized necessitating major exploratory reparative surgeries and future reparative surgeries including bowel resection, ostomy formation, bladder repair, ureteral damage repair was discussed with her. The patient's questions were answered to her satisfaction and she is ready to proceed with surgery.     Anastasio Auerbach MD, 3:29 PM 06/03/2017

## 2017-06-03 NOTE — Patient Instructions (Signed)
Followup for surgery as scheduled. 

## 2017-06-03 NOTE — H&P (Signed)
Sharon Simpson December 02, 1966 [id number]   History and Physical  Chief complaint: Menorrhagia, iron deficiency anemia, leiomyoma, endometrial polyps  History of present illness: 51 y.o. G3P3003 with long history of heavy menses including bleedthrough episodes and double protection. Underwent hysteroscopic polypectomy 2016 but unfortunately continued to bleed heavily since then. Subsequent sonohysterogram 2018 showed a 51 mm and a 24 mm leiomyoma. Also an area consistent with endometrial polyp. Endometrial biopsy was consistent with benign endometrial polyp. Her hemoglobin had been running in the 9 range although recently is 10.6 after iron supplementation.  Has been using Megace for menstrual suppression. Options for ongoing management of her menorrhagia was discussed.  More aggressive hormonal manipulation, Mirena IUD, endometrial ablation, Depo-Lupron suppression were reviewed with her. Possible repeat hysteroscopy with removal of the current endometrial polyp was also discussed. Recent FSH was 8. Patient ultimately has decided to proceed with hysterectomy as definitive treatment for her years of heavy bleeding. Has also decided to proceed with BSO. Reviewed various routes to include attempted [facility], LAVH, robotic-assisted, TAH. Ultimately has decided to proceed with attempt at LAVH BSO with TAH if unsuccessful.  Past medical history,surgical history, medications, allergies, family history and social history were all reviewed and documented in the EPIC chart.  ROS:  Was performed and pertinent positives and negatives are included in the history of present illness.  Exam:  [name] assistant Vitals:   06/03/17 1439  BP: 114/70   General: well developed, well nourished female, no acute distress HEENT: normal  Lungs: clear to auscultation without wheezing, rales or rhonchi  Cardiac: regular rate without rubs, murmurs or gallops  Abdomen: soft, nontender without masses, guarding, rebound,  organomegaly  Pelvic: external bus vagina: normal   Cervix: grossly normal  Uterus: Bulky, midline mobile nontender Adnexa: without masses or tenderness    Assessment/Plan:  51 y.o. [id number] with years of heavy menorrhagia to include double protection and bleedthrough episodes. Underwent hysteroscopic resection of endometrial polyp 2 years ago with unchanging her pattern. Most recent ultrasound shows leiomyoma and endometrial polyp. Patient wants to proceed with definitive treatment to include attempted LAVH BSO. She understands that at any time during the procedure I may convert to a TAH with a larger incision and a longer recovery period. Currently using vasectomy but she also understands hysterectomy is absolute and irreversible sterilization. Sexuality following hysterectomy was also reviewed and the potential for persistent dyspareunia and persistent orgasmic dysfunction was also discussed understood and accepted. The ovarian conservation issue was again reviewed with her as it was 05/07/2017. She has decided that she wants to proceed with bilateral salpingo-oophorectomies understanding that this will remove estrogen production and that we may want to consider HRT following. I discussed the issues of HRT to include the WHI study and the most recent NAMS 2017 guidelines. Risks to include thrombosis such as stroke heart attack DVT, the breast cancer issue with HRT and the potential benefits when started early to include symptom relief and cardiovascular/bone health was all discussed. We will readdress this postoperatively but at this point she definitely wants both ovaries removed.  The expected intraoperative and postoperative courses as well as the recovery period were reviewed. The risks of infection, prolonged antibiotics, reoperation for abscess or hematoma formation was discussed. The risks of hemorrhage necessitating transfusion and the risks of transfusion reaction, hepatitis, HIV, mad cow disease  and other unknown entities was also discussed. Incisional complications to include opening and draining of incisions and closure by secondary intention, dehiscence and long-term issues of  keloid/cosmetics and hernia formation were reviewed. The risk of inadvertent injury to internal organs including bowel, bladder, ureters, vessels, nerves either immediately recognized or delay recognized necessitating major exploratory reparative surgeries and future reparative surgeries including bowel resection, ostomy formation, bladder repair, ureteral damage repair was discussed with her. The patient's questions were answered to her satisfaction and she is ready to proceed with surgery.     Anastasio Auerbach MD, 4:04 PM 06/03/2017

## 2017-06-04 NOTE — Patient Instructions (Signed)
Your procedure is scheduled on:  Tuesday, June 11, 2017  Enter through the [employer] [facility] at:  6:00 AM  Pick up the phone at the desk and dial [phone].  Call this number if you have problems the morning of surgery: [phone].  Remember: Do NOT eat food or drink after:  Midnight Monday  Take these medicines the morning of surgery with a SIP OF WATER:  None  Stop ALL herbal medications and Omega 3 at this time  Do NOT smoke the day of surgery.  Do NOT wear jewelry (body piercing), metal hair clips/bobby pins, make-up, artifical eyelashes or nail polish. Do NOT wear lotions, powders, or perfumes.  You may wear deodorant. Do NOT shave for 48 hours prior to surgery. Do NOT bring valuables to the hospital. Contacts, dentures, or bridgework may not be worn into surgery.  Leave suitcase in car.  After surgery it may be brought to your room.  For patients admitted to the hospital, checkout time is 11:00 AM the day of discharge.  Bring a copy of your healthcare power of attorney and living will documents.

## 2017-06-05 ENCOUNTER — Encounter (HOSPITAL_COMMUNITY): Payer: Self-pay

## 2017-06-05 ENCOUNTER — Encounter (HOSPITAL_COMMUNITY)
Admission: RE | Admit: 2017-06-05 | Discharge: 2017-06-05 | Disposition: A | Discharge status: Home / Self Care | Payer: 59 | Source: Ambulatory Visit | Attending: Gynecology | Admitting: Gynecology

## 2017-06-05 DIAGNOSIS — D259 Leiomyoma of uterus, unspecified: Secondary | ICD-10-CM | POA: Diagnosis not present

## 2017-06-05 DIAGNOSIS — Z01818 Encounter for other preprocedural examination: Secondary | ICD-10-CM | POA: Insufficient documentation

## 2017-06-05 DIAGNOSIS — N938 Other specified abnormal uterine and vaginal bleeding: Secondary | ICD-10-CM | POA: Diagnosis not present

## 2017-06-05 DIAGNOSIS — D6489 Other specified anemias: Secondary | ICD-10-CM | POA: Insufficient documentation

## 2017-06-05 HISTORY — DX: Fibromyalgia: M79.7

## 2017-06-05 HISTORY — DX: Gastro-esophageal reflux disease without esophagitis: K21.9

## 2017-06-05 HISTORY — DX: Pain in left shoulder: M25.512

## 2017-06-05 LAB — CBC
HCT: 41.3 % (ref 36.0–46.0)
Hemoglobin: 13 g/dL (ref 12.0–15.0)
MCH: 25.9 pg — ABNORMAL LOW (ref 26.0–34.0)
MCHC: 31.5 g/dL (ref 30.0–36.0)
MCV: 82.3 fL (ref 78.0–100.0)
PLATELETS: 330 10*3/uL (ref 150–400)
RBC: 5.02 MIL/uL (ref 3.87–5.11)
RDW: 16.3 % — ABNORMAL HIGH (ref 11.5–15.5)
WBC: 8 10*3/uL (ref 4.0–10.5)

## 2017-06-05 LAB — COMPREHENSIVE METABOLIC PANEL
ALBUMIN: 3.9 g/dL (ref 3.5–5.0)
ALT: 19 U/L (ref 14–54)
ANION GAP: 7 (ref 5–15)
AST: 14 U/L — ABNORMAL LOW (ref 15–41)
Alkaline Phosphatase: 30 U/L — ABNORMAL LOW (ref 38–126)
BUN: 12 mg/dL (ref 6–20)
CHLORIDE: 107 mmol/L (ref 101–111)
CO2: 24 mmol/L (ref 22–32)
Calcium: 9 mg/dL (ref 8.9–10.3)
Creatinine, Ser: 0.75 mg/dL (ref 0.44–1.00)
GFR calc Af Amer: >60 mL/min (ref >60)
GFR calc non Af Amer: >60 mL/min (ref >60)
GLUCOSE: 108 mg/dL — AB (ref 65–99)
Potassium: 3.6 mmol/L (ref 3.5–5.1)
SODIUM: 138 mmol/L (ref 135–145)
TOTAL PROTEIN: 7.3 g/dL (ref 6.5–8.1)
Total Bilirubin: 0.5 mg/dL (ref 0.3–1.2)

## 2017-06-05 LAB — TYPE AND SCREEN
ABO/RH(D): A POS
Antibody Screen: NEGATIVE

## 2017-06-05 LAB — ABO/RH: ABO/RH(D): A POS

## 2017-06-06 DIAGNOSIS — Z0289 Encounter for other administrative examinations: Secondary | ICD-10-CM

## 2017-06-10 ENCOUNTER — Telehealth: Payer: Self-pay

## 2017-06-10 ENCOUNTER — Other Ambulatory Visit: Payer: Self-pay | Admitting: Gynecology

## 2017-06-10 MED ORDER — PHENAZOPYRIDINE HCL 200 MG PO TABS: ORAL_TABLET | ORAL | 0 refills | Status: DC

## 2017-06-10 NOTE — Telephone Encounter (Signed)
Patient informed. 

## 2017-06-10 NOTE — Telephone Encounter (Signed)
#  1 I am going to have her see Toney Rakes at 2 weeks postop #2 1-2 weeks #3  2-3 weeks

## 2017-06-10 NOTE — Telephone Encounter (Signed)
Left message to call.

## 2017-06-10 NOTE — Telephone Encounter (Signed)
I called patient to inform her about need for Pyridium tonight and in the am with a sip of water. Rx sent.  Patient had a couple of questions:  #1  When do you want her to return for post op visit in light of your absence?    #2  When can she drive with LAVH?  #3 When can she drive if you had to do TAH?

## 2017-06-11 ENCOUNTER — Encounter (HOSPITAL_COMMUNITY)
Admission: RE | Disposition: A | Discharge status: Home / Self Care | Payer: Self-pay | Source: Ambulatory Visit | Attending: Gynecology

## 2017-06-11 ENCOUNTER — Ambulatory Visit (HOSPITAL_COMMUNITY): Payer: 59 | Admitting: Anesthesiology

## 2017-06-11 ENCOUNTER — Encounter (HOSPITAL_COMMUNITY): Payer: Self-pay | Admitting: *Deleted

## 2017-06-11 ENCOUNTER — Ambulatory Visit (HOSPITAL_COMMUNITY)
Admission: RE | Admit: 2017-06-11 | Discharge: 2017-06-12 | Disposition: A | Discharge status: Home / Self Care | Payer: 59 | Source: Ambulatory Visit | Attending: Gynecology | Admitting: Gynecology

## 2017-06-11 ENCOUNTER — Telehealth: Payer: Self-pay

## 2017-06-11 DIAGNOSIS — Z87891 Personal history of nicotine dependence: Secondary | ICD-10-CM | POA: Insufficient documentation

## 2017-06-11 DIAGNOSIS — D259 Leiomyoma of uterus, unspecified: Secondary | ICD-10-CM | POA: Diagnosis not present

## 2017-06-11 DIAGNOSIS — N888 Other specified noninflammatory disorders of cervix uteri: Secondary | ICD-10-CM | POA: Insufficient documentation

## 2017-06-11 DIAGNOSIS — N800 Endometriosis of uterus: Secondary | ICD-10-CM | POA: Diagnosis not present

## 2017-06-11 DIAGNOSIS — D500 Iron deficiency anemia secondary to blood loss (chronic): Secondary | ICD-10-CM | POA: Diagnosis not present

## 2017-06-11 DIAGNOSIS — N920 Excessive and frequent menstruation with regular cycle: Secondary | ICD-10-CM | POA: Diagnosis not present

## 2017-06-11 DIAGNOSIS — N938 Other specified abnormal uterine and vaginal bleeding: Secondary | ICD-10-CM | POA: Diagnosis not present

## 2017-06-11 DIAGNOSIS — D649 Anemia, unspecified: Secondary | ICD-10-CM | POA: Diagnosis not present

## 2017-06-11 DIAGNOSIS — D509 Iron deficiency anemia, unspecified: Secondary | ICD-10-CM | POA: Diagnosis not present

## 2017-06-11 DIAGNOSIS — N840 Polyp of corpus uteri: Secondary | ICD-10-CM | POA: Insufficient documentation

## 2017-06-11 HISTORY — PX: TOTAL LAPAROSCOPIC HYSTERECTOMY WITH BILATERAL SALPINGO OOPHORECTOMY: SHX6845

## 2017-06-11 HISTORY — PX: LAPAROSCOPIC VAGINAL HYSTERECTOMY WITH SALPINGO OOPHORECTOMY: SHX6681

## 2017-06-11 HISTORY — PX: CYSTOSCOPY: SHX5120

## 2017-06-11 LAB — PREGNANCY, URINE: Preg Test, Ur: NEGATIVE

## 2017-06-11 SURGERY — HYSTERECTOMY, VAGINAL, LAPAROSCOPY-ASSISTED, WITH SALPINGO-OOPHORECTOMY
Anesthesia: General | Site: Bladder

## 2017-06-11 MED ORDER — SUGAMMADEX SODIUM 200 MG/2ML IV SOLN
INTRAVENOUS | Status: AC
  Filled 2017-06-11: qty 2

## 2017-06-11 MED ORDER — STERILE WATER FOR IRRIGATION IR SOLN
Status: DC | PRN
  Administered 2017-06-11: 1000 mL

## 2017-06-11 MED ORDER — LIDOCAINE-EPINEPHRINE 1 %-1:100000 IJ SOLN
INTRAMUSCULAR | Status: AC
  Filled 2017-06-11: qty 1

## 2017-06-11 MED ORDER — LIDOCAINE HCL (CARDIAC) 20 MG/ML IV SOLN
INTRAVENOUS | Status: AC
  Filled 2017-06-11: qty 5

## 2017-06-11 MED ORDER — MIDAZOLAM HCL 2 MG/2ML IJ SOLN
INTRAMUSCULAR | Status: DC | PRN
  Administered 2017-06-11: 1 mg via INTRAVENOUS

## 2017-06-11 MED ORDER — KETOROLAC TROMETHAMINE 30 MG/ML IJ SOLN
INTRAMUSCULAR | Status: DC | PRN
  Administered 2017-06-11: 30 mg via INTRAVENOUS

## 2017-06-11 MED ORDER — FLUORESCEIN SODIUM 10 % IV SOLN
500.0000 mg | Freq: Once | INTRAVENOUS | Status: DC
  Filled 2017-06-11: qty 5

## 2017-06-11 MED ORDER — SCOPOLAMINE 1 MG/3DAYS TD PT72
MEDICATED_PATCH | TRANSDERMAL | Status: AC
  Administered 2017-06-11: 1.5 mg via TRANSDERMAL
  Filled 2017-06-11: qty 1

## 2017-06-11 MED ORDER — PROPOFOL 10 MG/ML IV BOLUS
INTRAVENOUS | Status: DC | PRN
  Administered 2017-06-11: 150 mg via INTRAVENOUS

## 2017-06-11 MED ORDER — KETOROLAC TROMETHAMINE 30 MG/ML IJ SOLN: 30.0000 mg | Freq: Once | INTRAMUSCULAR | Status: DC | PRN

## 2017-06-11 MED ORDER — PROMETHAZINE HCL 25 MG/ML IJ SOLN: 6.2500 mg | INTRAMUSCULAR | Status: DC | PRN

## 2017-06-11 MED ORDER — DEXAMETHASONE SODIUM PHOSPHATE 10 MG/ML IJ SOLN
INTRAMUSCULAR | Status: AC
  Filled 2017-06-11: qty 1

## 2017-06-11 MED ORDER — KETOROLAC TROMETHAMINE 30 MG/ML IJ SOLN
INTRAMUSCULAR | Status: AC
  Filled 2017-06-11: qty 1

## 2017-06-11 MED ORDER — LIDOCAINE HCL (CARDIAC) 20 MG/ML IV SOLN
INTRAVENOUS | Status: DC | PRN
  Administered 2017-06-11: 80 mg via INTRAVENOUS

## 2017-06-11 MED ORDER — ONDANSETRON HCL 4 MG PO TABS: 4.0000 mg | ORAL_TABLET | Freq: Four times a day (QID) | ORAL | Status: DC | PRN

## 2017-06-11 MED ORDER — SCOPOLAMINE 1 MG/3DAYS TD PT72
1.0000 | MEDICATED_PATCH | Freq: Once | TRANSDERMAL | Status: DC
  Administered 2017-06-11: 1.5 mg via TRANSDERMAL

## 2017-06-11 MED ORDER — ONDANSETRON HCL 4 MG/2ML IJ SOLN: 4.0000 mg | Freq: Four times a day (QID) | INTRAMUSCULAR | Status: DC | PRN

## 2017-06-11 MED ORDER — PROPOFOL 10 MG/ML IV BOLUS
INTRAVENOUS | Status: AC
  Filled 2017-06-11: qty 20

## 2017-06-11 MED ORDER — DEXTROSE-NACL 5-0.9 % IV SOLN
INTRAVENOUS | Status: DC
  Administered 2017-06-11: 14:00:00 via INTRAVENOUS

## 2017-06-11 MED ORDER — MEPERIDINE HCL 25 MG/ML IJ SOLN: 6.2500 mg | INTRAMUSCULAR | Status: DC | PRN

## 2017-06-11 MED ORDER — SODIUM CHLORIDE 0.9% FLUSH
3.0000 mL | Freq: Two times a day (BID) | INTRAVENOUS | Status: DC
  Administered 2017-06-11: 3 mL via INTRAVENOUS

## 2017-06-11 MED ORDER — FENTANYL CITRATE (PF) 250 MCG/5ML IJ SOLN
INTRAMUSCULAR | Status: AC
  Filled 2017-06-11: qty 5

## 2017-06-11 MED ORDER — SUGAMMADEX SODIUM 200 MG/2ML IV SOLN
INTRAVENOUS | Status: DC | PRN
  Administered 2017-06-11: 170 mg via INTRAVENOUS

## 2017-06-11 MED ORDER — KETOROLAC TROMETHAMINE 30 MG/ML IJ SOLN: 30.0000 mg | Freq: Four times a day (QID) | INTRAMUSCULAR | Status: DC

## 2017-06-11 MED ORDER — FENTANYL CITRATE (PF) 100 MCG/2ML IJ SOLN: 25.0000 ug | INTRAMUSCULAR | Status: DC | PRN

## 2017-06-11 MED ORDER — LIDOCAINE-EPINEPHRINE 1 %-1:100000 IJ SOLN
INTRAMUSCULAR | Status: DC | PRN
  Administered 2017-06-11: 10 mL

## 2017-06-11 MED ORDER — OXYCODONE-ACETAMINOPHEN 5-325 MG PO TABS: 1.0000 | ORAL_TABLET | ORAL | Status: DC | PRN

## 2017-06-11 MED ORDER — MIDAZOLAM HCL 2 MG/2ML IJ SOLN
INTRAMUSCULAR | Status: AC
  Filled 2017-06-11: qty 2

## 2017-06-11 MED ORDER — LACTATED RINGERS IR SOLN
Status: DC | PRN
  Administered 2017-06-11: 3000 mL

## 2017-06-11 MED ORDER — ONDANSETRON HCL 4 MG/2ML IJ SOLN
INTRAMUSCULAR | Status: AC
  Filled 2017-06-11: qty 2

## 2017-06-11 MED ORDER — BUPIVACAINE HCL (PF) 0.25 % IJ SOLN
INTRAMUSCULAR | Status: AC
  Filled 2017-06-11: qty 30

## 2017-06-11 MED ORDER — DIPHENHYDRAMINE HCL 25 MG PO CAPS: 50.0000 mg | ORAL_CAPSULE | Freq: Four times a day (QID) | ORAL | Status: DC | PRN

## 2017-06-11 MED ORDER — LACTATED RINGERS IV SOLN
INTRAVENOUS | Status: DC
  Administered 2017-06-11 (×3): via INTRAVENOUS

## 2017-06-11 MED ORDER — ONDANSETRON HCL 4 MG/2ML IJ SOLN
INTRAMUSCULAR | Status: DC | PRN
  Administered 2017-06-11: 4 mg via INTRAVENOUS

## 2017-06-11 MED ORDER — FENTANYL CITRATE (PF) 100 MCG/2ML IJ SOLN
INTRAMUSCULAR | Status: DC | PRN
  Administered 2017-06-11 (×7): 50 ug via INTRAVENOUS
  Administered 2017-06-11: 25 ug via INTRAVENOUS
  Administered 2017-06-11: 50 ug via INTRAVENOUS
  Administered 2017-06-11: 25 ug via INTRAVENOUS
  Administered 2017-06-11: 50 ug via INTRAVENOUS

## 2017-06-11 MED ORDER — CEFOTETAN DISODIUM-DEXTROSE 2-2.08 GM-% IV SOLR
INTRAVENOUS | Status: AC
  Filled 2017-06-11: qty 50

## 2017-06-11 MED ORDER — FLUTICASONE PROPIONATE 50 MCG/ACT NA SUSP
1.0000 | Freq: Every day | NASAL | Status: DC
  Administered 2017-06-11 – 2017-06-12 (×2): 1 via NASAL
  Filled 2017-06-11: qty 16

## 2017-06-11 MED ORDER — DEXAMETHASONE SODIUM PHOSPHATE 10 MG/ML IJ SOLN
INTRAMUSCULAR | Status: DC | PRN
  Administered 2017-06-11: 10 mg via INTRAVENOUS

## 2017-06-11 MED ORDER — ROCURONIUM BROMIDE 100 MG/10ML IV SOLN
INTRAVENOUS | Status: DC | PRN
  Administered 2017-06-11: 40 mg via INTRAVENOUS
  Administered 2017-06-11: 5 mg via INTRAVENOUS
  Administered 2017-06-11 (×2): 10 mg via INTRAVENOUS

## 2017-06-11 MED ORDER — CEFOTETAN DISODIUM-DEXTROSE 2-2.08 GM-% IV SOLR
2.0000 g | INTRAVENOUS | Status: AC
  Administered 2017-06-11: 2 g via INTRAVENOUS

## 2017-06-11 MED ORDER — KETOROLAC TROMETHAMINE 30 MG/ML IJ SOLN
30.0000 mg | Freq: Four times a day (QID) | INTRAMUSCULAR | Status: DC
  Administered 2017-06-11 – 2017-06-12 (×3): 30 mg via INTRAVENOUS
  Filled 2017-06-11 (×3): qty 1

## 2017-06-11 MED ORDER — MORPHINE SULFATE (PF) 4 MG/ML IV SOLN
1.0000 mg | INTRAVENOUS | Status: DC | PRN
  Administered 2017-06-11: 2 mg via INTRAVENOUS
  Filled 2017-06-11: qty 1

## 2017-06-11 MED ORDER — BUPIVACAINE HCL (PF) 0.25 % IJ SOLN
INTRAMUSCULAR | Status: DC | PRN
  Administered 2017-06-11: 7 mL

## 2017-06-11 MED ORDER — ROCURONIUM BROMIDE 100 MG/10ML IV SOLN
INTRAVENOUS | Status: AC
  Filled 2017-06-11: qty 1

## 2017-06-11 SURGICAL SUPPLY — 53 items
APL SRG 38 LTWT LNG FL B (MISCELLANEOUS)
APPLICATOR ARISTA FLEXITIP XL (MISCELLANEOUS) IMPLANT
CABLE HIGH FREQUENCY MONO STRZ (ELECTRODE) IMPLANT
CLOTH BEACON ORANGE TIMEOUT ST (SAFETY) ×3 IMPLANT
CONT PATH 16OZ SNAP LID 3702 (MISCELLANEOUS) ×3 IMPLANT
COVER BACK TABLE 60X90IN (DRAPES) ×3 IMPLANT
COVER MAYO STAND STRL (DRAPES) ×3 IMPLANT
DECANTER SPIKE VIAL GLASS SM (MISCELLANEOUS) ×4 IMPLANT
DRSG OPSITE POSTOP 3X4 (GAUZE/BANDAGES/DRESSINGS) ×1 IMPLANT
DURAPREP 26ML APPLICATOR (WOUND CARE) ×3 IMPLANT
ELECT REM PT RETURN 9FT ADLT (ELECTROSURGICAL) ×3
ELECTRODE REM PT RTRN 9FT ADLT (ELECTROSURGICAL) ×2 IMPLANT
FILTER SMOKE EVAC LAPAROSHD (FILTER) ×3 IMPLANT
GLOVE BIOGEL PI IND STRL 7.0 (GLOVE) ×6 IMPLANT
GLOVE BIOGEL PI IND STRL 7.5 (GLOVE) IMPLANT
GLOVE BIOGEL PI IND STRL 8 (GLOVE) IMPLANT
GLOVE BIOGEL PI INDICATOR 7.0 (GLOVE) ×3
GLOVE BIOGEL PI INDICATOR 7.5 (GLOVE) ×3
GLOVE BIOGEL PI INDICATOR 8 (GLOVE) ×3
GLOVE SURG SS PI 7.0 STRL IVOR (GLOVE) ×3 IMPLANT
GLOVE SURG SS PI 7.5 STRL IVOR (GLOVE) ×3 IMPLANT
GLOVE SURG SS PI 8.0 STRL IVOR (GLOVE) ×3 IMPLANT
HEMOSTAT ARISTA ABSORB 3G PWDR (MISCELLANEOUS) IMPLANT
LEGGING LITHOTOMY PAIR STRL (DRAPES) ×3 IMPLANT
NDL MAYO CATGUT SZ4 TPR NDL (NEEDLE) IMPLANT
NEEDLE MAYO CATGUT SZ4 (NEEDLE) IMPLANT
NS IRRIG 1000ML POUR BTL (IV SOLUTION) ×3 IMPLANT
PACK LAVH (CUSTOM PROCEDURE TRAY) ×3 IMPLANT
PACK ROBOTIC GOWN (GOWN DISPOSABLE) ×3 IMPLANT
PACK TRENDGUARD 450 HYBRID PRO (MISCELLANEOUS) IMPLANT
PACK TRENDGUARD 600 HYBRD PROC (MISCELLANEOUS) IMPLANT
PAD OB MATERNITY 4.3X12.25 (PERSONAL CARE ITEMS) ×3 IMPLANT
POUCH LAPAROSCOPIC INSTRUMENT (MISCELLANEOUS) ×3 IMPLANT
PROTECTOR NERVE ULNAR (MISCELLANEOUS) ×6 IMPLANT
SCISSORS LAP 5X35 DISP (ENDOMECHANICALS) IMPLANT
SET CYSTO W/LG BORE CLAMP LF (SET/KITS/TRAYS/PACK) ×1 IMPLANT
SET IRRIG TUBING LAPAROSCOPIC (IRRIGATION / IRRIGATOR) ×1 IMPLANT
SHEARS HARMONIC ACE PLUS 36CM (ENDOMECHANICALS) ×1 IMPLANT
SLEEVE XCEL OPT CAN 5 100 (ENDOMECHANICALS) ×3 IMPLANT
SOLUTION ELECTROLUBE (MISCELLANEOUS) IMPLANT
STRIP CLOSURE SKIN 1/4X3 (GAUZE/BANDAGES/DRESSINGS) IMPLANT
SUT PLAIN 4 0 FS 2 27 (SUTURE) ×3 IMPLANT
SUT VIC AB 0 CT1 18XCR BRD8 (SUTURE) ×6 IMPLANT
SUT VIC AB 0 CT1 36 (SUTURE) ×3 IMPLANT
SUT VIC AB 0 CT1 8-18 (SUTURE) ×9
SUT VICRYL 0 TIES 12 18 (SUTURE) ×3 IMPLANT
SUT VICRYL 0 UR6 27IN ABS (SUTURE) ×3 IMPLANT
SYR BULB IRRIGATION 50ML (SYRINGE) ×3 IMPLANT
TOWEL OR 17X24 6PK STRL BLUE (TOWEL DISPOSABLE) ×6 IMPLANT
TRAY FOLEY CATH SILVER 14FR (SET/KITS/TRAYS/PACK) ×3 IMPLANT
TRENDGUARD 450 HYBRID PRO PACK (MISCELLANEOUS) ×3
TRENDGUARD 600 HYBRID PROC PK (MISCELLANEOUS)
TROCAR XCEL NON-BLD 11X100MML (ENDOMECHANICALS) ×3 IMPLANT

## 2017-06-11 NOTE — H&P (Signed)
The patient was examined.  I reviewed the proposed surgery and consent form with the patient.  The dictated history and physical is current and accurate and all questions were answered. The patient is ready to proceed with surgery and has a realistic understanding and expectation for the outcome.   Anastasio Auerbach MD, 7:04 AM 06/11/2017

## 2017-06-11 NOTE — Op Note (Addendum)
Sharon Simpson 1966-04-01 [id number]   Post Operative Note   Date of surgery:  06/11/2017  Pre Op Dx:  Menorrhagia, iron deficiency anemia, leiomyoma, endometrial polyps  Post Op Dx:  Same  Procedure:  Laparoscopic-assisted vaginal hysterectomy, bilateral salpingo-oophorectomy, cystoscopy  Surgeon:  Anastasio Auerbach  Assistant:  Uvaldo Rising  Anesthesia:  General  EBL:  650 cc anesthesia reported  Complications:  None  Specimen:  Uterus, bilateral fallopian tubes, bilateral ovaries. Clinical weight 355 g to pathology  Findings: EUA:  External BUS vagina normal. Cervix with obstetrical scarring. Uterus grossly normal midline, mobile. Adnexa without gross masses   Operative:  Anterior cul-de-sac normal. Posterior cul-de-sac normal. Uterus grossly normal in size with large pedunculated vascular fundal myoma. Right and left ovaries normal. Right and left fallopian tubes normal. No evidence of pelvic adhesions or endometriosis. Upper abdominal exam shows liver smooth with gallbladder grossly normal. Appendix not visualized. No upper abdominal pathology noted.   Cystoscopy: Normal with no evidence of bladder injury and bilateral ureteral jets.  Procedure:  The patient was taken to the operating room, placed in the low dorsal lithotomy position, underwent general anesthesia and received an abdominal, perineal/vaginal preparation per nursing personnel. The timeout was performed by the surgical team. An EUA was performed and a Hulka tenaculum was placed on the cervix. A Foley catheter was placed in sterile technique and the patient was draped in usual fashion. A transverse infraumbilical incision was made and using the direct entry 10 mm laparoscopic trocar the abdomen was directly entered under direct visualization without difficulty and subsequently insufflated. Right and left 5 mm suprapubic ports were placed after transillumination for the vessels under direct visualization without  difficulty. Examination of the epic organs and upper abdominal exam was carried out with findings noted above. Using the harmonic scalpel the left infundibulopelvic ligament and vessels were identified, the ureter identified away from the operative site and the pedicle was transected without difficulty. The adnexa was then freed using the harmonic scalpel and the left round ligament was then transected without difficulty. The parametrial peritoneal reflections were then transected and the anterior vesicouterine peritoneal fold was superficially sharply incised to the midline. A similar procedure was carried out on the other side meeting the vesicouterine peritoneal incision in the midline. At this time attention was then turned to the vaginal portion of the procedure and the patient was placed in the high dorsolithotomy position, the Hulka tenaculum removed, the cervix visualized with a weighted speculum, grasped with a single-tooth tenaculum and the cervical mucosa was circumferentially injected using 1% lidocaine with 1:100,000 epinephrine dilution.  The cervical mucosa was then sharply incised circumferentially and the paracervical planes were sharply developed. The anterior cul-de-sac was sharply entered without difficulty as was the posterior cul-de-sac and a long weighted speculum was placed. The right and left uterosacral ligaments were then identified, clamped cut and ligated using 0 Vicryl suture and tagged for future reference. The uterus was then progressively freed from its attachments through clamping cutting and ligating of the paracervical and parametrial tissues using 0 Vicryl suture. The uterine vessels were identified bilaterally and were ligated separately using 0 Vicryl suture. Due to the bulk of the uterus it was necessary to core the myometrium to allow for vaginal delivery and ultimately the remaining attachments were clamped cut and ligated using 0 Vicryl suture and the specimen removed. The  long weighted speculum was replaced with a shorter weighted speculum, the vaginal cuff grasped with an Allis clamp and the  cul-de-sac was packed with a tagged tail sponge. The posterior vaginal mucosa was run from uterosacral ligament to uterosacral ligament using 0 Vicryl suture, the tail sponge removed and the vagina was closed anterior to posterior using 0 Vicryl suture in interrupted figure-of-eight stitch. The vagina was irrigated showing adequate hemostasis. The Foley catheter was removed and cystoscopy was performed showing no evidence of bladder injury and bilateral ureteral jets. The Foley catheter was replaced, the patient placed in low dorsal lithotomy position, the surgical team regloved and regowned and the abdomen was reinsufflated. Inspection of the pelvis showed adequate hemostasis of all pedicles and several small bleeding points were addressed using bipolar cautery. The gas was slowly allowed to escape and adequate hemostasis was maintained under a low pressure situation. The right and left suprapubic ports were removed under direct visualization showing adequate hemostasis and the infraumbilical port was then backed out under direct visualization showing adequate hemostasis and no evidence or hernia formation. The sponge, needle and instrument count were verified correct and the specimen was identified to be sent to pathology. The incisions were all injected using 0.25% Marcaine and the infraumbilical incision was closed using 0 Vicryl suture in a subcutaneous fascial stitch and 4-0 plain suture in a running subcuticular stitch with subsequent LiquiBand skin adhesive applied. The 5 mm ports were closed with Dermabond skin adhesive. The patient received intraoperative Toradol, was awakened without difficulty and was taken recovery room in good condition having tolerated the procedure well.     Anastasio Auerbach MD, 10:20 AM 06/11/2017

## 2017-06-11 NOTE — Telephone Encounter (Signed)
I called Pathology at Dr. Zelphia Cairo request and I spoke with Almyra Free. "Notify pathology just to make sure they know that the patient also had an endometrial polyp along with her leiomyoma as a preoperative diagnoses."  I read this to her and she will add it.

## 2017-06-11 NOTE — Anesthesia Procedure Notes (Signed)
Procedure Name: Intubation Date/Time: 06/11/2017 7:33 AM Performed by: Georgeanne Nim Pre-anesthesia Checklist: Patient identified, Emergency Drugs available, Suction available, Patient being monitored and Timeout performed Patient Re-evaluated:Patient Re-evaluated prior to inductionOxygen Delivery Method: Circle system utilized Preoxygenation: Pre-oxygenation with 100% oxygen Intubation Type: IV induction Ventilation: Mask ventilation without difficulty Laryngoscope Size: Mac and 3 Grade View: Grade I Number of attempts: 1 Airway Equipment and Method: Stylet Secured at: 21 cm Dental Injury: Teeth and Oropharynx as per pre-operative assessment

## 2017-06-11 NOTE — Transfer of Care (Signed)
Immediate Anesthesia Transfer of Care Note  Patient: Sharon Simpson  Procedure(s) Performed: Procedure(s): LAPAROSCOPIC ASSISTED VAGINAL HYSTERECTOMY WITH SALPINGO OOPHORECTOMY (Bilateral) CYSTOSCOPY (N/A)  Patient Location: PACU  Anesthesia Type:General  Level of Consciousness: awake, alert , oriented and patient cooperative  Airway & Oxygen Therapy: Patient Spontanous Breathing and Patient connected to nasal cannula oxygen  Post-op Assessment: Report given to RN and Post -op Vital signs reviewed and stable  Post vital signs: Reviewed and stable  Last Vitals:  Vitals:   06/11/17 0615  BP: 118/60  Pulse: 79  Resp: 16  Temp: 37 C    Last Pain:  Vitals:   06/11/17 0615  TempSrc: Oral      Patients Stated Pain Goal: 4 (87/86/76 7209)  Complications: No apparent anesthesia complications

## 2017-06-11 NOTE — Anesthesia Preprocedure Evaluation (Addendum)
Anesthesia Evaluation  Patient identified by MRN, date of birth, ID band Patient awake    Reviewed: Allergy & Precautions, NPO status , Patient's Chart, lab work & pertinent test results  Airway Mallampati: II       Dental no notable dental hx. (+) Teeth Intact   Pulmonary former smoker,    Pulmonary exam normal breath sounds clear to auscultation       Cardiovascular negative cardio ROS Normal cardiovascular exam Rhythm:Regular Rate:Normal     Neuro/Psych negative psych ROS   GI/Hepatic Neg liver ROS,   Endo/Other  negative endocrine ROS  Renal/GU negative Renal ROS     Musculoskeletal   Abdominal (+) + obese,   Peds  Hematology   Anesthesia Other Findings   Reproductive/Obstetrics                           Anesthesia Physical Anesthesia Plan  ASA: II  Anesthesia Plan: General   Post-op Pain Management:    Induction: Intravenous  PONV Risk Score and Plan: 4 or greater and Ondansetron, Dexamethasone, Propofol, Midazolam and Scopolamine patch - Pre-op  Airway Management Planned: Oral ETT  Additional Equipment:   Intra-op Plan:   Post-operative Plan: Extubation in OR  Informed Consent: I have reviewed the patients History and Physical, chart, labs and discussed the procedure including the risks, benefits and alternatives for the proposed anesthesia with the patient or authorized representative who has indicated his/her understanding and acceptance.   Dental advisory given  Plan Discussed with: CRNA and Surgeon  Anesthesia Plan Comments:        Anesthesia Quick Evaluation

## 2017-06-11 NOTE — Anesthesia Postprocedure Evaluation (Signed)
Anesthesia Post Note  Patient: Sharon Simpson  Procedure(s) Performed: Procedure(s) (LRB): LAPAROSCOPIC ASSISTED VAGINAL HYSTERECTOMY WITH SALPINGO OOPHORECTOMY (Bilateral) CYSTOSCOPY (N/A)     Patient location during evaluation: [facility] Anesthesia Type: General Level of consciousness: awake and alert Pain management: pain level controlled Vital Signs Assessment: post-procedure vital signs reviewed and stable Respiratory status: spontaneous breathing Cardiovascular status: blood pressure returned to baseline Postop Assessment: no backache, no signs of nausea or vomiting and adequate PO intake Anesthetic complications: no    Last Vitals:  Vitals:   06/11/17 1100 06/11/17 1130  BP: 112/74 (!) 113/54  Pulse: 80 82  Resp: 14 18  Temp: 36.8 C 36.8 C    Last Pain:  Vitals:   06/11/17 1631  TempSrc:   PainSc: 3    Pain Goal: Patients Stated Pain Goal: 3 (06/11/17 1631)               Jasten Guyette

## 2017-06-11 NOTE — OR Nursing (Signed)
LATEX ALLERGY PRECAUTIONS FOLLOWED.

## 2017-06-11 NOTE — Progress Notes (Signed)
In to see patient.  Resting comfortably Afeb, VSS Abd soft, incisions dry intact Results of surgery reviewed with patient and husband Continue routine PO care with anticipated D/C in AM

## 2017-06-12 DIAGNOSIS — N800 Endometriosis of uterus: Secondary | ICD-10-CM | POA: Diagnosis not present

## 2017-06-12 DIAGNOSIS — N888 Other specified noninflammatory disorders of cervix uteri: Secondary | ICD-10-CM | POA: Diagnosis not present

## 2017-06-12 DIAGNOSIS — D259 Leiomyoma of uterus, unspecified: Secondary | ICD-10-CM | POA: Diagnosis not present

## 2017-06-12 DIAGNOSIS — D509 Iron deficiency anemia, unspecified: Secondary | ICD-10-CM | POA: Diagnosis not present

## 2017-06-12 DIAGNOSIS — N840 Polyp of corpus uteri: Secondary | ICD-10-CM | POA: Diagnosis not present

## 2017-06-12 DIAGNOSIS — Z87891 Personal history of nicotine dependence: Secondary | ICD-10-CM | POA: Diagnosis not present

## 2017-06-12 DIAGNOSIS — N920 Excessive and frequent menstruation with regular cycle: Secondary | ICD-10-CM | POA: Diagnosis not present

## 2017-06-12 LAB — CBC
HEMATOCRIT: 34.9 % — AB (ref 36.0–46.0)
Hemoglobin: 11.1 g/dL — ABNORMAL LOW (ref 12.0–15.0)
MCH: 25.8 pg — AB (ref 26.0–34.0)
MCHC: 31.8 g/dL (ref 30.0–36.0)
MCV: 81.2 fL (ref 78.0–100.0)
PLATELETS: 299 10*3/uL (ref 150–400)
RBC: 4.3 MIL/uL (ref 3.87–5.11)
RDW: 15.7 % — AB (ref 11.5–15.5)
WBC: 13.4 10*3/uL — AB (ref 4.0–10.5)

## 2017-06-12 MED ORDER — OXYCODONE-ACETAMINOPHEN 5-325 MG PO TABS: 1.0000 | ORAL_TABLET | ORAL | 0 refills | Status: DC | PRN

## 2017-06-12 NOTE — Progress Notes (Signed)
Patient ID: Sharon Simpson, female   DOB: [birth year], 51 y.o.   MRN: [id number] [place] NONAKA [birth year] [id number]   1 Day Post-Op Procedure(s) (LRB): LAPAROSCOPIC ASSISTED VAGINAL HYSTERECTOMY WITH SALPINGO OOPHORECTOMY (Bilateral) CYSTOSCOPY (N/A)  Subjective: Patient reports feels well, no acute distress, pain severity reported mild, Yes.   taking PO, foley catheter out, Yes.   voiding, Yes.   ambulating, Yes.   passing flatus  Objective: Vital signs in last 24 hours: Temp:  [98.2 F (36.8 C)-99.1 F (37.3 C)] 98.7 F (37.1 C) (07/04 0800) Pulse Rate:  [65-105] 74 (07/04 0800) Resp:  [11-18] 18 (07/04 0800) BP: (83-130)/(40-74) 93/48 (07/04 0800) SpO2:  [98 %-100 %] 100 % (07/04 0800) Weight:  [188 lb 12.8 oz (85.6 kg)] 188 lb 12.8 oz (85.6 kg) (07/03 1700) Last BM Date: 06/10/17    EXAM General: awake, alert and no distress Resp: clear to auscultation bilaterally Cardio: RRR GI: soft, minimal tenderness, bowel sounds active, incisions dry intact Lower Extremities: Without swelling or tenderness Vaginal Bleeding: Reported scant   Lab Results:   Recent Labs  06/12/17 0535  WBC 13.4*  HGB 11.1*  HCT 34.9*  PLT 299    Assessment: s/p Procedure(s): LAPAROSCOPIC ASSISTED VAGINAL HYSTERECTOMY WITH SALPINGO OOPHORECTOMY CYSTOSCOPY: progressing well, ready for discharge. Eating, ambulating, voiding, good pain relief on PO meds  Plan: Discharge home today.  Precautions, instructions and follow up were discussed with the patient.  Prescriptions provided per AVS.  Patient to call the office to arrange a post-operative appointmant in 2 weeks.    Anastasio Auerbach MD, 8:55 AM 06/12/2017

## 2017-06-12 NOTE — Progress Notes (Signed)
Patient discharged home with husband. Discharge paperwork, home-care, follow-up appts, and prescriptions reviewed. No questions at this time.

## 2017-06-12 NOTE — Discharge Instructions (Signed)
°  Postoperative Instructions Hysterectomy ° °Dr. Laiken Sandy and the nursing staff have discussed postoperative instructions with you.  If you have any questions please ask them before you leave the hospital, or call Dr Oveda Dadamo’s office at [phone].   ° °We would like to emphasize the following instructions: ° ° °  Call the office to make your follow-up appointment as recommended by Dr Caylee Vlachos (usually 2 weeks). ° °  You were given a prescription, or one was ordered for you at the pharmacy you designated.  Get that prescription filled and take the medication according to instructions. ° °  You may eat a regular diet, but slowly until you start having bowel movements. ° °  Drink plenty of water daily. ° °  Nothing in the vagina (intercourse, douching, objects of any kind) until released by Dr Yamile Roedl. ° °  No driving for two weeks.  Wait to be cleared by Dr Hisayo Delossantos at your first post op check.  Car rides (short) are ok after several days at home, as long as you are not having significant pain, but no traveling out of town. ° °  You may shower, but no baths.  Walking up and down stairs is ok.  No heavy lifting, prolonged standing, repeated bending or any “working out” until your first post op check. ° °  Rest frequently, listen to your body and do not push yourself and overdo it. ° °  Call if: ° °o Your pain medication does not seem strong enough. °o Worsening pain or abdominal bloating °o Persistent nausea or vomiting °o Difficulty with urination or bowel movements. °o Temperature of 101 degrees or higher. °o Bleeding heavier then staining (clots or period type flow). °o Incisions become red, tender or begin to drain. °o You have any questions or concerns. °

## 2017-06-13 ENCOUNTER — Telehealth: Payer: Self-pay

## 2017-06-13 ENCOUNTER — Encounter (HOSPITAL_COMMUNITY): Payer: Self-pay | Admitting: Gynecology

## 2017-06-13 NOTE — Telephone Encounter (Signed)
See result note.  

## 2017-06-13 NOTE — Telephone Encounter (Signed)
Dr. Lyndon Code is in my voice mail stating that he did look at the "serosal nodule" and it is all benign. He asked me to call him back with what I want him to do now. Did I want him to include a comment about it in the report?

## 2017-06-17 ENCOUNTER — Encounter: Payer: 59 | Admitting: Gastroenterology

## 2017-06-17 MED FILL — Lactated Ringer's Solution: INTRAVENOUS | Qty: 1000 | Status: AC

## 2017-06-25 ENCOUNTER — Ambulatory Visit (INDEPENDENT_AMBULATORY_CARE_PROVIDER_SITE_OTHER): Payer: 59 | Admitting: Gynecology

## 2017-06-25 ENCOUNTER — Encounter: Payer: Self-pay | Admitting: Gynecology

## 2017-06-25 VITALS — BP 118/76

## 2017-06-25 DIAGNOSIS — Z79890 Hormone replacement therapy (postmenopausal): Secondary | ICD-10-CM | POA: Diagnosis not present

## 2017-06-25 DIAGNOSIS — N3001 Acute cystitis with hematuria: Secondary | ICD-10-CM

## 2017-06-25 DIAGNOSIS — R300 Dysuria: Secondary | ICD-10-CM | POA: Diagnosis not present

## 2017-06-25 LAB — URINALYSIS W MICROSCOPIC + REFLEX CULTURE
Bilirubin Urine: NEGATIVE
Casts: NONE SEEN [LPF]
Crystals: NONE SEEN [HPF]
Glucose, UA: NEGATIVE
Ketones, ur: NEGATIVE
Nitrite: NEGATIVE
Specific Gravity, Urine: >1.03 (ref 1.001–1.035)
WBC, UA: >60 WBC/HPF — AB (ref <=5)
Yeast: NONE SEEN [HPF]
pH: 5 (ref 5.0–8.0)

## 2017-06-25 MED ORDER — PHENAZOPYRIDINE HCL 200 MG PO TABS: 200.0000 mg | ORAL_TABLET | Freq: Three times a day (TID) | ORAL | 0 refills | Status: DC | PRN

## 2017-06-25 MED ORDER — ESTRADIOL 0.05 MG/24HR TD PTTW: 1.0000 | MEDICATED_PATCH | TRANSDERMAL | 12 refills | Status: DC

## 2017-06-25 MED ORDER — NITROFURANTOIN MONOHYD MACRO 100 MG PO CAPS: 100.0000 mg | ORAL_CAPSULE | Freq: Two times a day (BID) | ORAL | 0 refills | Status: DC

## 2017-06-25 NOTE — Patient Instructions (Addendum)
Phenazopyridine tablets What is this medicine? PHENAZOPYRIDINE (fen az oh PEER i deen) is a pain reliever. It is used to stop the pain, burning, or discomfort caused by infection or irritation of the urinary tract. This medicine is not an antibiotic. It will not cure a urinary tract infection. This medicine may be used for other purposes; ask your health care provider or pharmacist if you have questions. COMMON BRAND NAME(S): AZO, Azo-100, Azo-Gesic, Azo-Septic, Azo-Standard, Phenazo, Prodium, Pyridium, Urinary Analgesic, Uristat What should I tell my health care provider before I take this medicine? They need to know if you have any of these conditions: -glucose-6-phosphate dehydrogenase (G6PD) deficiency -kidney disease -an unusual or allergic reaction to phenazopyridine, other medicines, foods, dyes, or preservatives -pregnant or trying to get pregnant -breast-feeding How should I use this medicine? Take this medicine by mouth with a glass of water. Follow the directions on the prescription label. Take after meals. Take your doses at regular intervals. Do not take your medicine more often than directed. Do not skip doses or stop your medicine early even if you feel better. Do not stop taking except on your doctor's advice. Talk to your pediatrician regarding the use of this medicine in children. Special care may be needed. Overdosage: If you think you have taken too much of this medicine contact a poison control center or emergency room at once. NOTE: This medicine is only for you. Do not share this medicine with others. What if I miss a dose? If you miss a dose, take it as soon as you can. If it is almost time for your next dose, take only that dose. Do not take double or extra doses. What may interact with this medicine? Interactions are not expected. This list may not describe all possible interactions. Give your health care provider a list of all the medicines, herbs, non-prescription  drugs, or dietary supplements you use. Also tell them if you smoke, drink alcohol, or use illegal drugs. Some items may interact with your medicine. What should I watch for while using this medicine? Tell your doctor or health care professional if your symptoms do not improve or if they get worse. This medicine colors body fluids red. This effect is harmless and will go away after you are done taking the medicine. It will change urine to an dark orange or red color. The red color may stain clothing. Soft contact lenses may become permanently stained. It is best not to wear soft contact lenses while taking this medicine. If you are diabetic you may get a false positive result for sugar in your urine. Talk to your health care provider. What side effects may I notice from receiving this medicine? Side effects that you should report to your doctor or health care professional as soon as possible: -allergic reactions like skin rash, itching or hives, swelling of the face, lips, or tongue -blue or purple color of the skin -difficulty breathing -fever -less urine -unusual bleeding, bruising -unusual tired, weak -vomiting -yellowing of the eyes or skin Side effects that usually do not require medical attention (report to your doctor or health care professional if they continue or are bothersome): -dark urine -headache -stomach upset This list may not describe all possible side effects. Call your doctor for medical advice about side effects. You may report side effects to FDA at 1-800-FDA-1088. Where should I keep my medicine? Keep out of the reach of children. Store at room temperature between 15 and 30 degrees C (59 and 86   degrees F). Protect from light and moisture. Throw away any unused medicine after the expiration date. NOTE: This sheet is a summary. It may not cover all possible information. If you have questions about this medicine, talk to your doctor, pharmacist, or health care provider.   2018 Elsevier/Gold Standard (2008-06-24 11:04:07) Nitrofurantoin tablets or capsules What is this medicine? NITROFURANTOIN (nye troe fyoor AN toyn) is an antibiotic. It is used to treat urinary tract infections. This medicine may be used for other purposes; ask your health care provider or pharmacist if you have questions. COMMON BRAND NAME(S): Macrobid, Macrodantin, Urotoin What should I tell my health care provider before I take this medicine? They need to know if you have any of these conditions: -anemia -diabetes -glucose-6-phosphate dehydrogenase deficiency -kidney disease -liver disease -lung disease -other chronic illness -an unusual or allergic reaction to nitrofurantoin, other antibiotics, other medicines, foods, dyes or preservatives -pregnant or trying to get pregnant -breast-feeding How should I use this medicine? Take this medicine by mouth with a glass of water. Follow the directions on the prescription label. Take this medicine with food or milk. Take your doses at regular intervals. Do not take your medicine more often than directed. Do not stop taking except on your doctor's advice. Talk to your pediatrician regarding the use of this medicine in children. While this drug may be prescribed for selected conditions, precautions do apply. Overdosage: If you think you have taken too much of this medicine contact a poison control center or emergency room at once. NOTE: This medicine is only for you. Do not share this medicine with others. What if I miss a dose? If you miss a dose, take it as soon as you can. If it is almost time for your next dose, take only that dose. Do not take double or extra doses. What may interact with this medicine? -antacids containing magnesium trisilicate -probenecid -quinolone antibiotics like ciprofloxacin, lomefloxacin, norfloxacin and ofloxacin -sulfinpyrazone This list may not describe all possible interactions. Give your health care provider  a list of all the medicines, herbs, non-prescription drugs, or dietary supplements you use. Also tell them if you smoke, drink alcohol, or use illegal drugs. Some items may interact with your medicine. What should I watch for while using this medicine? Tell your doctor or health care professional if your symptoms do not improve or if you get new symptoms. Drink several glasses of water a day. If you are taking this medicine for a long time, visit your doctor for regular checks on your progress. If you are diabetic, you may get a false positive result for sugar in your urine with certain brands of urine tests. Check with your doctor. What side effects may I notice from receiving this medicine? Side effects that you should report to your doctor or health care professional as soon as possible: -allergic reactions like skin rash or hives, swelling of the face, lips, or tongue -chest pain -cough -difficulty breathing -dizziness, drowsiness -fever or infection -joint aches or pains -pale or blue-tinted skin -redness, blistering, peeling or loosening of the skin, including inside the mouth -tingling, burning, pain, or numbness in hands or feet -unusual bleeding or bruising -unusually weak or tired -yellowing of eyes or skin Side effects that usually do not require medical attention (report to your doctor or health care professional if they continue or are bothersome): -dark urine -diarrhea -headache -loss of appetite -nausea or vomiting -temporary hair loss This list may not describe all possible side effects. Call  your doctor for medical advice about side effects. You may report side effects to FDA at 1-800-FDA-1088. Where should I keep my medicine? Keep out of the reach of children. Store at room temperature between 15 and 30 degrees C (59 and 86 degrees F). Protect from light. Throw away any unused medicine after the expiration date. NOTE: This sheet is a summary. It may not cover all  possible information. If you have questions about this medicine, talk to your doctor, pharmacist, or health care provider.  2018 Elsevier/Gold Standard (2008-06-16 15:56:47) Urinary Tract Infection, Adult A urinary tract infection (UTI) is an infection of any part of the urinary tract, which includes the kidneys, ureters, bladder, and urethra. These organs make, store, and get rid of urine in the body. UTI can be a bladder infection (cystitis) or kidney infection (pyelonephritis). What are the causes? This infection may be caused by fungi, viruses, or bacteria. Bacteria are the most common cause of UTIs. This condition can also be caused by repeated incomplete emptying of the bladder during urination. What increases the risk? This condition is more likely to develop if:  You ignore your need to urinate or hold urine for long periods of time.  You do not empty your bladder completely during urination.  You wipe back to front after urinating or having a bowel movement, if you are female.  You are uncircumcised, if you are female.  You are constipated.  You have a urinary catheter that stays in place (indwelling).  You have a weak defense (immune) system.  You have a medical condition that affects your bowels, kidneys, or bladder.  You have diabetes.  You take antibiotic medicines frequently or for long periods of time, and the antibiotics no longer work well against certain types of infections (antibiotic resistance).  You take medicines that irritate your urinary tract.  You are exposed to chemicals that irritate your urinary tract.  You are female.  What are the signs or symptoms? Symptoms of this condition include:  Fever.  Frequent urination or passing small amounts of urine frequently.  Needing to urinate urgently.  Pain or burning with urination.  Urine that smells bad or unusual.  Cloudy urine.  Pain in the lower abdomen or back.  Trouble urinating.  Blood in  the urine.  Vomiting or being less hungry than normal.  Diarrhea or abdominal pain.  Vaginal discharge, if you are female.  How is this diagnosed? This condition is diagnosed with a medical history and physical exam. You will also need to provide a urine sample to test your urine. Other tests may be done, including:  Blood tests.  Sexually transmitted disease (STD) testing.  If you have had more than one UTI, a cystoscopy or imaging studies may be done to determine the cause of the infections. How is this treated? Treatment for this condition often includes a combination of two or more of the following:  Antibiotic medicine.  Other medicines to treat less common causes of UTI.  Over-the-counter medicines to treat pain.  Drinking enough water to stay hydrated.  Follow these instructions at home:  Take over-the-counter and prescription medicines only as told by your health care provider.  If you were prescribed an antibiotic, take it as told by your health care provider. Do not stop taking the antibiotic even if you start to feel better.  Avoid alcohol, caffeine, tea, and carbonated beverages. They can irritate your bladder.  Drink enough fluid to keep your urine clear or pale  yellow.  Keep all follow-up visits as told by your health care provider. This is important.  Make sure to: ? Empty your bladder often and completely. Do not hold urine for long periods of time. ? Empty your bladder before and after sex. ? Wipe from front to back after a bowel movement if you are female. Use each tissue one time when you wipe. Contact a health care provider if:  You have back pain.  You have a fever.  You feel nauseous or vomit.  Your symptoms do not get better after 3 days.  Your symptoms go away and then return. Get help right away if:  You have severe back pain or lower abdominal pain.  You are vomiting and cannot keep down any medicines or water. This information is  not intended to replace advice given to you by your health care provider. Make sure you discuss any questions you have with your health care provider. Document Released: 09/05/2005 Document Revised: 05/09/2016 Document Reviewed: 10/17/2015 Elsevier Interactive Patient Education  2017 [employer]  Estradiol skin patches What is this medicine? ESTRADIOL (es tra DYE ole) skin patches contain an estrogen. It is mostly used as hormone replacement in menopausal women. It helps to treat hot flashes and prevent osteoporosis. It is also used to treat women with low estrogen levels or those who have had their ovaries removed. This medicine may be used for other purposes; ask your health care provider or pharmacist if you have questions. COMMON BRAND NAME(S): Alora, Climara, Esclim, Estraderm, FemPatch, Menostar, Minivelle, Vivelle, Vivelle-Dot What should I tell my health care provider before I take this medicine? They need to know if you have any of these conditions: -abnormal vaginal bleeding -blood vessel disease or blood clots -breast, cervical, endometrial, ovarian, liver, or uterine cancer -dementia -diabetes -gallbladder disease -heart disease or recent heart attack -high blood pressure -high cholesterol -high level of calcium in the blood -hysterectomy -kidney disease -liver disease -migraine headaches -protein C deficiency -protein S deficiency -stroke -systemic lupus erythematosus (SLE) -tobacco smoker -an unusual or allergic reaction to estrogens, other hormones, medicines, foods, dyes, or preservatives -pregnant or trying to get pregnant -breast-feeding How should I use this medicine? This medicine is for external use only. Follow the directions on the prescription label. Tear open the pouch, do not use scissors. Remove the stiff protective liner covering the adhesive. Try not to touch the adhesive. Apply the patch, sticky side to the skin, to an area that is clean, dry and  hairless. Avoid injured, irritated, calloused, or scarred areas. Do not apply the skin patches to your breasts or around the waistline. Use a different site each time to prevent skin irritation. Do not cut or trim the patch. Do not stop using except on the advice of your doctor or health care professional. Do not wear more than one patch at a time unless you are told to do so by your doctor or health care professional. Contact your pediatrician regarding the use of this medicine in children. Special care may be needed. A patient package insert for the product will be given with each prescription and refill. Read this sheet carefully each time. The sheet may change frequently. Overdosage: If you think you have taken too much of this medicine contact a poison control center or emergency room at once. NOTE: This medicine is only for you. Do not share this medicine with others. What if I miss a dose? If you miss a dose, apply it as soon  as you can. If it is almost time for your next dose, apply only that dose. Do not apply double or extra doses. What may interact with this medicine? Do not take this medicine with any of the following medications: -aromatase inhibitors like aminoglutethimide, anastrozole, exemestane, letrozole, testolactone This medicine may also interact with the following medications: -carbamazepine -certain antibiotics used to treat infections -certain barbiturates used for inducing sleep or treating seizures -grapefruit juice -medicines for fungus infections like itraconazole and ketoconazole -raloxifene or tamoxifen -rifabutin, rifampin, or rifapentine -ritonavir -St. John's Wort This list may not describe all possible interactions. Give your health care provider a list of all the medicines, herbs, non-prescription drugs, or dietary supplements you use. Also tell them if you smoke, drink alcohol, or use illegal drugs. Some items may interact with your medicine. What should I  watch for while using this medicine? Visit your doctor or health care professional for regular checks on your progress. You will need a regular breast and pelvic exam and Pap smear while on this medicine. You should also discuss the need for regular mammograms with your health care professional, and follow his or her guidelines for these tests. This medicine can make your body retain fluid, making your fingers, hands, or ankles swell. Your blood pressure can go up. Contact your doctor or health care professional if you feel you are retaining fluid. If you have any reason to think you are pregnant, stop taking this medicine right away and contact your doctor or health care professional. Smoking increases the risk of getting a blood clot or having a stroke while you are taking this medicine, especially if you are more than 52 years old. You are strongly advised not to smoke. If you wear contact lenses and notice visual changes, or if the lenses begin to feel uncomfortable, consult your eye doctor or health care professional. This medicine can increase the risk of developing a condition (endometrial hyperplasia) that may lead to cancer of the lining of the uterus. Taking progestins, another hormone drug, with this medicine lowers the risk of developing this condition. Therefore, if your uterus has not been removed (by a hysterectomy), your doctor may prescribe a progestin for you to take together with your estrogen. You should know, however, that taking estrogens with progestins may have additional health risks. You should discuss the use of estrogens and progestins with your health care professional to determine the benefits and risks for you. If you are going to have surgery or an MRI, you may need to stop taking this medicine. Consult your health care professional for advice before you schedule the surgery. Contact with water while you are swimming, using a sauna, bathing, or showering may cause the patch to  fall off. If your patch falls off reapply it. If you cannot reapply the patch, apply a new patch to another area and continue to follow your usual dose schedule. What side effects may I notice from receiving this medicine? Side effects that you should report to your doctor or health care professional as soon as possible: -allergic reactions like skin rash, itching or hives, swelling of the face, lips, or tongue -breast tissue changes or discharge -changes in vision -chest pain -confusion, trouble speaking or understanding -dark urine -general ill feeling or flu-like symptoms -light-colored stools -nausea, vomiting -pain, swelling, warmth in the leg -right upper belly pain -severe headaches -shortness of breath -sudden numbness or weakness of the face, arm or leg -trouble walking, dizziness, loss of balance or  coordination -unusual vaginal bleeding -yellowing of the eyes or skin Side effects that usually do not require medical attention (report to your doctor or health care professional if they continue or are bothersome): -hair loss -increased hunger or thirst -increased urination -symptoms of vaginal infection like itching, irritation or unusual discharge -unusually weak or tired This list may not describe all possible side effects. Call your doctor for medical advice about side effects. You may report side effects to FDA at 1-800-FDA-1088. Where should I keep my medicine? Keep out of the reach of children. Store at room temperature below 30 degrees C (86 degrees F). Do not store any patches that have been removed from their protective pouch. Throw away any unused medicine after the expiration date. Dispose of used patches properly. Since used patches may still contain active hormones, fold the patch in half so that it sticks to itself prior to disposal. NOTE: This sheet is a summary. It may not cover all possible information. If you have questions about this medicine, talk to your  doctor, pharmacist, or health care provider.  2018 Elsevier/Gold Standard (2016-06-19 12:58:11)  Menopause and Hormone Replacement Therapy What is hormone replacement therapy? Hormone replacement therapy (HRT) is the use of artificial (synthetic) hormones to replace hormones that your body stops producing during menopause. Menopause is the normal time of life when menstrual periods stop completely and the ovaries stop producing the female hormones estrogen and progesterone. This lack of hormones can affect your health and cause undesirable symptoms. HRT can relieve some of those symptoms. What are my options for HRT? HRT may consist of the synthetic hormones estrogen and progestin, or it may consist of only estrogen (estrogen-only therapy). You and your health care provider will decide which form of HRT is best for you. If you choose to be on HRT and you have a uterus, estrogen and progestin are usually prescribed. Estrogen-only therapy is used for women who do not have a uterus. Possible options for taking HRT include:  Pills.  Patches.  Gels.  Sprays.  Vaginal cream.  Vaginal rings.  Vaginal inserts.  The amount of hormone(s) that you take and how long you take the hormone(s) varies depending on your individual health. It is important to:  Begin HRT with the lowest possible dosage.  Stop HRT as soon as your health care provider tells you to stop.  Work with your health care provider so that you feel informed and comfortable with your decisions.  What are the benefits of HRT? HRT can reduce the frequency and severity of menopausal symptoms. Benefits of HRT vary depending on the menopausal symptoms that you have, the severity of your symptoms, and your overall health. HRT may help to improve the following menopausal symptoms:  Hot flashes and night sweats. These are sudden feelings of heat that spread over the face and body. The skin may turn red, like a blush. Night sweats are  hot flashes that happen while you are sleeping or trying to sleep.  Bone loss (osteoporosis). The body loses calcium more quickly after menopause, causing the bones to become weaker. This can increase the risk for bone breaks (fractures).  Vaginal dryness. The lining of the vagina can become thin and dry, which can cause pain during sexual intercourse or cause infection, burning, or itching.  Urinary tract infections.  Urinary incontinence. This is a decreased ability to control when you urinate.  Irritability.  Short-term memory problems.  What are the risks of HRT? Risks of HRT vary  depending on your individual health and medical history. Risks of HRT also depend on whether you receive both estrogen and progestin or you receive estrogen only.HRT may increase the risk of:  Spotting. This is when a small amount of bloodleaks from the vagina unexpectedly.  Endometrial cancer. This cancer is in the lining of the uterus (endometrium).  Breast cancer.  Increased density of breast tissue. This can make it harder to find breast cancer on a breast X-ray (mammogram).  Stroke.  Heart attack.  Blood clots.  Gallbladder disease.  Risks of HRT can increase if you have any of the following conditions:  Endometrial cancer.  Liver disease.  Heart disease.  Breast cancer.  History of blood clots.  History of stroke.  How should I care for myself while I am on HRT?  Take over-the-counter and prescription medicines only as told by your health care provider.  Get mammograms, pelvic exams, and medical checkups as often as told by your health care provider.  Have Pap tests done as often as told by your health care provider. A Pap test is sometimes called a Pap smear. It is a screening test that is used to check for signs of cancer of the cervix and vagina. A Pap test can also identify the presence of infection or precancerous changes. Pap tests may be done: ? Every 3 years,  starting at age 34. ? Every 5 years, starting after age 58, in combination with testing for human papillomavirus (HPV). ? More often or less often depending on other medical conditions you have, your age, and other risk factors.  It is your responsibility to get your Pap test results. Ask your health care provider or the department performing the test when your results will be ready.  Keep all follow-up visits as told by your health care provider. This is important. When should I seek medical care? Talk with your health care provider if:  You have any of these: ? Pain or swelling in your legs. ? Shortness of breath. ? Chest pain. ? Lumps or changes in your breasts or armpits. ? Slurred speech. ? Pain, burning, or bleeding when you urine.  You develop any of these: ? Unusual vaginal bleeding. ? Dizziness or headaches. ? Weakness or numbness in any part of your arms or legs. ? Pain in your abdomen.  This information is not intended to replace advice given to you by your health care provider. Make sure you discuss any questions you have with your health care provider. Document Released: 08/25/2003 Document Revised: 10/23/2016 Document Reviewed: 05/30/2015 Elsevier Interactive Patient Education  2017 [employer]. Menopause Menopause is the normal time of life when menstrual periods stop completely. Menopause is complete when you have missed 12 consecutive menstrual periods. It usually occurs between the ages of 92 years and 64 years. Very rarely does a woman develop menopause before the age of 84 years. At menopause, your ovaries stop producing the female hormones estrogen and progesterone. This can cause undesirable symptoms and also affect your health. Sometimes the symptoms may occur 4-5 years before the menopause begins. There is no relationship between menopause and:  Oral contraceptives.  Number of children you had.  Race.  The age your menstrual periods started  (menarche).  Heavy smokers and very thin women may develop menopause earlier in life. What are the causes?  The ovaries stop producing the female hormones estrogen and progesterone. Other causes include:  Surgery to remove both ovaries.  The ovaries stop functioning for  no known reason.  Tumors of the pituitary gland in the brain.  Medical disease that affects the ovaries and hormone production.  Radiation treatment to the abdomen or pelvis.  Chemotherapy that affects the ovaries.  What are the signs or symptoms?  Hot flashes.  Night sweats.  Decrease in sex drive.  Vaginal dryness and thinning of the vagina causing painful intercourse.  Dryness of the skin and developing wrinkles.  Headaches.  Tiredness.  Irritability.  Memory problems.  Weight gain.  Bladder infections.  Hair growth of the face and chest.  Infertility. More serious symptoms include:  Loss of bone (osteoporosis) causing breaks (fractures).  Depression.  Hardening and narrowing of the arteries (atherosclerosis) causing heart attacks and strokes.  How is this diagnosed?  When the menstrual periods have stopped for 12 straight months.  Physical exam.  Hormone studies of the blood. How is this treated? There are many treatment choices and nearly as many questions about them. The decisions to treat or not to treat menopausal changes is an individual choice made with your health care provider. Your health care provider can discuss the treatments with you. Together, you can decide which treatment will work best for you. Your treatment choices may include:  Hormone therapy (estrogen and progesterone).  Non-hormonal medicines.  Treating the individual symptoms with medicine (for example antidepressants for depression).  Herbal medicines that may help specific symptoms.  Counseling by a psychiatrist or psychologist.  Group therapy.  Lifestyle changes including: ? Eating  healthy. ? Regular exercise. ? Limiting caffeine and alcohol. ? Stress management and meditation.  No treatment.  Follow these instructions at home:  Take the medicine your health care provider gives you as directed.  Get plenty of sleep and rest.  Exercise regularly.  Eat a diet that contains calcium (good for the bones) and soy products (acts like estrogen hormone).  Avoid alcoholic beverages.  Do not smoke.  If you have hot flashes, dress in layers.  Take supplements, calcium, and vitamin D to strengthen bones.  You can use over-the-counter lubricants or moisturizers for vaginal dryness.  Group therapy is sometimes very helpful.  Acupuncture may be helpful in some cases. Contact a health care provider if:  You are not sure you are in menopause.  You are having menopausal symptoms and need advice and treatment.  You are still having menstrual periods after age 23 years.  You have pain with intercourse.  Menopause is complete (no menstrual period for 12 months) and you develop vaginal bleeding.  You need a referral to a specialist (gynecologist, psychiatrist, or psychologist) for treatment. Get help right away if:  You have severe depression.  You have excessive vaginal bleeding.  You fell and think you have a broken bone.  You have pain when you urinate.  You develop leg or chest pain.  You have a fast pounding heart beat (palpitations).  You have severe headaches.  You develop vision problems.  You feel a lump in your breast.  You have abdominal pain or severe indigestion. This information is not intended to replace advice given to you by your health care provider. Make sure you discuss any questions you have with your health care provider. Document Released: 02/16/2004 Document Revised: 05/03/2016 Document Reviewed: 06/25/2013 Elsevier Interactive Patient Education  2017 [employer].

## 2017-06-25 NOTE — Progress Notes (Signed)
   Patient is a 51 year old that presented to the office for 2 weeks postop visit and was complaining of dysuria and some frequency no fever, chills, nausea, vomiting or any back pain. Review of patient's record indicated on July 3 my partner Dr. Phineas Real and I had performed a laparoscopic-assisted vaginal hysterectomy with bilateral salpingo-oophorectomy and cystoscopy as a result of symptomatic fibroids and dysfunctional uterine bleeding and chronic anemia. Patient currently on no hormone replacement therapy is not having any vasomotor symptoms. Findings from surgery were discussed with the patient as well as pathology report.  Findings: "EUA:  External BUS vagina normal. Cervix with obstetrical scarring. Uterus grossly normal midline, mobile. Adnexa without gross masses                         Operative:  Anterior cul-de-sac normal. Posterior cul-de-sac normal. Uterus grossly normal in size with large pedunculated vascular fundal myoma. Right and left ovaries normal. Right and left fallopian tubes normal. No evidence of pelvic adhesions or endometriosis. Upper abdominal exam shows liver smooth with gallbladder grossly normal. Appendix not visualized. No upper abdominal pathology noted.                         Cystoscopy: Normal with no evidence of bladder injury and bilateral ureteral jets."  Pathology report: Diagnosis Uterus, ovaries and fallopian tubes, with cervix CERVIX: ATROPHY. ENDOMETRIUM: SECRETORY-TYPE ENDOMETRIUM WITH DECIDUALIZATION OF THE STROMA, CONSISTENT WITH HORMONE EFFECT. - ENDOMETRIOID-TYPE POLYP. MYOMETRIUM: ADENOMYOSIS. SEROSA: UNREMARKABLE. RIGHT ADNEXA: BENIGN OVARY AND FALLOPIAN TUBE. LEFT ADNEXA: BENIGN OVARY AND FALLOPIAN TUBE.  ADDITIONAL INFORMATION: The adherent tissue to the serosa reveals benign smooth muscle, consistent with leiomyoma  Exam: Back: No CVA tenderness Abdomen: Soft slightly tender right lower abdomen but no rebound or guarding Pelvic:  Bartholin urethra Skene was within normal limits Vagina: No gross lesions on inspection Vaginal cuff intact suture material present Bimanual exam no palpable mass or tenderness Rectal exam not done  Urinalysis: Dipstick 2+ blood, trace protein, 2+ leukocyte  Microscopic greater than 60 white blood cells per high-power field, 40-60 RBC, many bacteria culture submitted  Assessment/plan: Patient 2 weeks status post laparoscopic-assisted vaginal hysterectomy with bilateral salpingo-oophorectomy and cystoscopy as a result of symptomatic leiomyomatous uteri benign pathology. Patient with signs and symptoms and findings consistent with urinary tract infection will be prescribed Macrobid one by mouth twice a day for 7 days. For bladder spasms she will be prescribed Pyridium 200 mg 3 times a day for 3 days. We had a lengthy discussion on hormone replacement therapy as well as the new guidelines and previous women's health initiative studies. We discussed potential risk of DVT, pulmonary embolism and breast cancer. We discussed the different regimens of estrogen replacement therapy from vaginal ring to transdermal gels and creams as well as patches and oral forms. Patient's she had not having any symptoms she is 51 years of age we proceeded with giving her a prescription for Vivelle-Dot 0.05 mg/24-hour patch to apply twice a week if she becomes symptomatic. She's otherwise scheduled to return back in 4 weeks and follow-up with my partner for her postop appointment.

## 2017-06-27 LAB — URINE CULTURE

## 2017-07-10 ENCOUNTER — Ambulatory Visit: Payer: 59 | Admitting: Gynecology

## 2017-07-23 ENCOUNTER — Encounter: Payer: Self-pay | Admitting: Gynecology

## 2017-07-23 ENCOUNTER — Ambulatory Visit (INDEPENDENT_AMBULATORY_CARE_PROVIDER_SITE_OTHER): Payer: 59 | Admitting: Gynecology

## 2017-07-23 VITALS — BP 118/76

## 2017-07-23 DIAGNOSIS — Z09 Encounter for follow-up examination after completed treatment for conditions other than malignant neoplasm: Secondary | ICD-10-CM

## 2017-07-23 NOTE — Progress Notes (Signed)
    MADILYN CEPHAS 05-23-1966 [id number]        51 y.o.  [id number] presents for postoperative checkup status post LAVH BSO 06/11/2017. His done well since. Was treated for UTI by Dr. Toney Rakes almost a month ago with resolution of her symptoms. Is not having significant hot flushes or sweats.  Past medical history,surgical history, problem list, medications, allergies, family history and social history were all reviewed and documented in the EPIC chart.  Directed ROS with pertinent positives and negatives documented in the history of present illness/assessment and plan.  Exam: [name] assistant Vitals:   07/23/17 1205  BP: 118/76   General appearance:  Normal Abdomen soft nontender without masses guarding rebound. Incisions healed nicely. Pelvic external BUS vagina with healed vaginal cuff. Bimanual without masses or tenderness.  Assessment/Plan:  51 y.o. [id number] with normal postoperative visit. Reviewed pathology which showed leiomyoma, endometrial polyp, adenomyosis. Is not having symptoms of lack of estrogen. I again reviewed the issues of ERT to include use in a patient without significant symptoms for cardiovascular and bone health indications. Current studies reviewed. Risks to include thrombosis such as stroke heart attack DVT and the breast cancer issue was also discussed. Patient has a prescription for the estradiol patches given by Dr. Toney Rakes and she is going to make a decision whether she wants to initiate now or wait. Recommended patient follow up end of this year for annual exam. She'll slowly resume all normal activities. I did ask her to abstain from intercourse for another 2 weeks.    Anastasio Auerbach MD, 12:21 PM 07/23/2017

## 2017-07-23 NOTE — Patient Instructions (Signed)
Follow-up end of this year for annual exam 

## 2017-09-10 ENCOUNTER — Telehealth: Payer: 59 | Admitting: Family

## 2017-09-10 DIAGNOSIS — B9689 Other specified bacterial agents as the cause of diseases classified elsewhere: Secondary | ICD-10-CM

## 2017-09-10 DIAGNOSIS — J019 Acute sinusitis, unspecified: Secondary | ICD-10-CM | POA: Diagnosis not present

## 2017-09-10 MED ORDER — AMOXICILLIN-POT CLAVULANATE 875-125 MG PO TABS: 1.0000 | ORAL_TABLET | Freq: Two times a day (BID) | ORAL | 0 refills | Status: DC

## 2017-09-10 NOTE — Progress Notes (Signed)

## 2017-12-10 HISTORY — PX: BREAST BIOPSY: SHX20

## 2018-01-16 ENCOUNTER — Ambulatory Visit: Payer: 59 | Admitting: Sports Medicine

## 2018-01-16 VITALS — BP 110/72 | Ht 65.0 in | Wt 175.0 lb

## 2018-01-16 DIAGNOSIS — M79674 Pain in right toe(s): Secondary | ICD-10-CM | POA: Diagnosis not present

## 2018-01-16 DIAGNOSIS — M25559 Pain in unspecified hip: Secondary | ICD-10-CM | POA: Diagnosis not present

## 2018-01-16 MED ORDER — GABAPENTIN 300 MG PO CAPS: 300.0000 mg | ORAL_CAPSULE | Freq: Every evening | ORAL | 1 refills | Status: DC | PRN

## 2018-01-17 ENCOUNTER — Encounter: Payer: Self-pay | Admitting: Sports Medicine

## 2018-01-17 NOTE — Progress Notes (Signed)
   Subjective:    Patient ID: Sharon Simpson, female    DOB: [birth year], 52 y.o.   MRN: [id number]  HPI chief complaint: Bilateral thigh numbness and left hip pain  52 year old female comes in today complaining of long-standing left thigh numbness. She describes numbness primarily over the lateral thigh which has been present for about 4 years. Is becoming more and more uncomfortable. She most recently began to develop pain and popping along the lateral hip and leg as well. This was most noticeable with walking and with getting up from a seated position. It has started to improve but still has pain with prolonged walking. She denies any groin pain. No numbness or tingling. She has had problems with her low back in the past and is concerned that her symptoms may be originating from her lumbar spine. An MRI done in 2010 showed some facet arthropathy but no spinal or foraminal stenosis. She denies pain or numbness past the knee. She denies weakness. No pain at rest. She has begun to develop some slight numbness in the right lateral thigh as well. She is here today with her husband.  Past medical history reviewed Medications reviewed Allergies reviewed    Review of Systems    as above Objective:   Physical Exam  Well-developed, well-nourished. No acute distress. Awake alert and oriented 3. Vital signs reviewed  Lumbar spine: Good lumbar range of motion. No spasm.  Examination of each hip shows smooth painless passive and active range of motion. She does have a tight IT band bilaterally. Negative logroll. Significant hip abductor weakness against resistance. No palpable tenderness. Slightly decreased sensation to light touch along the lateral thigh. Positive Trendelenburg bilaterally.  Genu valgus with standing. Slight pronation with walking. Hallux rigidus of the right first MTP joint.      Assessment & Plan:   Long-standing left thigh numbness likely secondary to meralgia  paraesthetica Pronation with walking Hip abductor weakness Hallux rigidus, right first MTP joint  I think the patient has 2 separate issues. I believe the pain and popping along the lateral hip is likely secondary to her significant hip abductor weakness. We will give her a home exercise program consisting of hip abductor and pelvic stabilizer strengthening exercises. I'm also going to fit her with some green sports insoles with a first ray post on the right for her first MTP OA. We could consider custom orthotics at a later date if she finds this to be comfortable. For her meralgia paraesthetica we will start gabapentin 300 mg daily at bedtime. Follow-up with me in 6 weeks. I discussed the possibility of formal physical therapy if symptoms do not improve. I reassured her that I do not believe her symptoms are originating from her lumbar spine and I do not believe we need to get updated imaging at this point in time. Call with questions or concerns prior to follow-up.

## 2018-02-25 ENCOUNTER — Ambulatory Visit: Payer: 59 | Admitting: Sports Medicine

## 2018-05-07 ENCOUNTER — Other Ambulatory Visit: Payer: Self-pay

## 2018-05-07 ENCOUNTER — Encounter: Payer: Self-pay | Admitting: Family Medicine

## 2018-05-07 ENCOUNTER — Ambulatory Visit (INDEPENDENT_AMBULATORY_CARE_PROVIDER_SITE_OTHER): Payer: 59 | Admitting: Family Medicine

## 2018-05-07 VITALS — BP 136/90 | HR 82 | Temp 98.2°F | Ht 65.0 in | Wt 201.6 lb

## 2018-05-07 DIAGNOSIS — Z1231 Encounter for screening mammogram for malignant neoplasm of breast: Secondary | ICD-10-CM

## 2018-05-07 DIAGNOSIS — D229 Melanocytic nevi, unspecified: Secondary | ICD-10-CM

## 2018-05-07 DIAGNOSIS — M533 Sacrococcygeal disorders, not elsewhere classified: Secondary | ICD-10-CM

## 2018-05-07 DIAGNOSIS — Z1211 Encounter for screening for malignant neoplasm of colon: Secondary | ICD-10-CM

## 2018-05-07 DIAGNOSIS — G8929 Other chronic pain: Secondary | ICD-10-CM | POA: Insufficient documentation

## 2018-05-07 DIAGNOSIS — E669 Obesity, unspecified: Secondary | ICD-10-CM

## 2018-05-07 DIAGNOSIS — M797 Fibromyalgia: Secondary | ICD-10-CM | POA: Insufficient documentation

## 2018-05-07 DIAGNOSIS — Z1239 Encounter for other screening for malignant neoplasm of breast: Secondary | ICD-10-CM

## 2018-05-07 DIAGNOSIS — B001 Herpesviral vesicular dermatitis: Secondary | ICD-10-CM | POA: Insufficient documentation

## 2018-05-07 DIAGNOSIS — Z0000 Encounter for general adult medical examination without abnormal findings: Secondary | ICD-10-CM

## 2018-05-07 LAB — COMPREHENSIVE METABOLIC PANEL
ALK PHOS: 55 U/L (ref 39–117)
ALT: 30 U/L (ref 0–35)
AST: 19 U/L (ref 0–37)
Albumin: 4.2 g/dL (ref 3.5–5.2)
BUN: 10 mg/dL (ref 6–23)
CHLORIDE: 104 meq/L (ref 96–112)
CO2: 28 meq/L (ref 19–32)
Calcium: 9.3 mg/dL (ref 8.4–10.5)
Creatinine, Ser: 0.71 mg/dL (ref 0.40–1.20)
GFR: 91.64 mL/min (ref >60.00)
GLUCOSE: 97 mg/dL (ref 70–99)
Potassium: 3.9 mEq/L (ref 3.5–5.1)
SODIUM: 139 meq/L (ref 135–145)
TOTAL PROTEIN: 7.3 g/dL (ref 6.0–8.3)
Total Bilirubin: 0.4 mg/dL (ref 0.2–1.2)

## 2018-05-07 LAB — CBC WITH DIFFERENTIAL/PLATELET
Basophils Absolute: 0 10*3/uL (ref 0.0–0.1)
Basophils Relative: 0.4 % (ref 0.0–3.0)
EOS PCT: 2.3 % (ref 0.0–5.0)
Eosinophils Absolute: 0.2 10*3/uL (ref 0.0–0.7)
HCT: 41.6 % (ref 36.0–46.0)
Hemoglobin: 13.6 g/dL (ref 12.0–15.0)
LYMPHS ABS: 2.3 10*3/uL (ref 0.7–4.0)
Lymphocytes Relative: 31.5 % (ref 12.0–46.0)
MCHC: 32.7 g/dL (ref 30.0–36.0)
MCV: 85.7 fl (ref 78.0–100.0)
MONO ABS: 0.5 10*3/uL (ref 0.1–1.0)
Monocytes Relative: 6.8 % (ref 3.0–12.0)
NEUTROS PCT: 59 % (ref 43.0–77.0)
Neutro Abs: 4.4 10*3/uL (ref 1.4–7.7)
PLATELETS: 333 10*3/uL (ref 150.0–400.0)
RBC: 4.85 Mil/uL (ref 3.87–5.11)
RDW: 13.2 % (ref 11.5–15.5)
WBC: 7.4 10*3/uL (ref 4.0–10.5)

## 2018-05-07 LAB — LDL CHOLESTEROL, DIRECT: LDL DIRECT: 161 mg/dL

## 2018-05-07 LAB — LIPID PANEL
CHOL/HDL RATIO: 5
Cholesterol: 232 mg/dL — ABNORMAL HIGH (ref 0–200)
HDL: 46.6 mg/dL (ref >39.00)
NONHDL: 185.25
Triglycerides: 265 mg/dL — ABNORMAL HIGH (ref 0.0–149.0)
VLDL: 53 mg/dL — AB (ref 0.0–40.0)

## 2018-05-07 NOTE — Patient Instructions (Addendum)
Thank you for establishing and allowing me to care for you. It means a lot to me.   Please schedule a follow up appointment with me in 1 year for your Physical.  Please come fasting.   Please go to the Lab for blood work.    If you have MyChart, your results will be available to view, please respond through [place] with questions.  We will schedule follow-up according to results.  We have placed the order for your Colonoscopy. You will receive a call for scheduling.   We have ordered your Mammogram, you will receive a call for scheduling.   Please schedule your eye exam and make sure they send [place] the results.   Please do these things to maintain good health!   Exercise at least 30-45 minutes a day,  4-5 days a week.   Eat a low-fat diet with lots of fruits and vegetables, up to 7-9 servings per day.  Drink plenty of water daily. Try to drink 8 8oz glasses per day.  Seatbelts can save your life. Always wear your seatbelt.  Place Smoke Detectors on every level of your home and check batteries every year.  Schedule an appointment with an eye doctor for an eye exam every 1-2 years  Safe sex - use condoms to protect yourself from STDs if you could be exposed to these types of infections. Use birth control if you do not want to become pregnant and are sexually active.  Avoid heavy alcohol use. If you drink, keep it to less than 2 drinks/day and not every day.  [place].  Choose someone you trust that could speak for you if you became unable to speak for yourself.  Depression is common in our stressful world.If you're feeling down or losing interest in things you normally enjoy, please come in for a visit.  If anyone is threatening or hurting you, please get help. Physical or Emotional Violence is never OK.    Please read the patient education material about Living [place] [place] and [place].    Both the Living Will and [place]  require a Insurance account manager to [employer] on either document.  You may complete just one or both or neither of the documents. It is voluntaary. However, it can be very helpful if needed and I strongly encourage you to complete both forms stating your Advanced Directives. This will help me and your other healthcare providers care for you.  If you choose to complete either or both documents, please bring in or mail copies of the completed documents to my office so that I may put them in your medical chart. This way will ensure we have them if needed.

## 2018-05-07 NOTE — Progress Notes (Signed)
Subjective  Chief Complaint  Patient presents with  . Establish Care    Transfer from [facility], last physical 02/27/2017, no other complaints, needs mammogram & Cologuard     HPI: Sharon Simpson is a 52 y.o. female who presents to [place] [facility] today for a Female Wellness Visit. She also has the concerns and/or needs as listed above in the chief complaint. These will be addressed in addition to the Health Maintenance Visit.   Wellness Visit: annual visit with health maintenance review and exam without Pap (s/p hysterectomy BSO)   Due for both breast can colon cancer screening: avg risk for both. Due for labs. Has GYN.   S/p surgical menopause for menorrhagia on black cohash. Has mild hot flashes and has noted weight gain. Eats well. Exercises.   Has some back pain - sees sports med. Remote h/o fibromyalgia: manages her stress and does well.    Assessment  1. Annual physical exam   2. Breast cancer screening   3. Colon cancer screening   4. Obesity (BMI 30-39.9)   5. Atypical mole      Plan   Orders Placed This Encounter  Procedures  . MM Digital Screening  . CBC with Differential/Platelet  . Comprehensive metabolic panel  . Lipid panel  . HIV antibody  . Ambulatory referral to Gastroenterology   No orders of the defined types were placed in this encounter.    Female Wellness Visit:  Age appropriate Health Maintenance and Prevention measures were discussed with patient. Included topics are cancer screening recommendations, ways to keep healthy (see AVS) including dietary and exercise recommendations, regular eye and dental care, use of seat belts, and avoidance of moderate alcohol use and tobacco use.   BMI: discussed patient's BMI and encouraged positive lifestyle modifications to help get to or maintain a target BMI.  HM needs and immunizations were addressed and ordered. See below for orders. See HM and immunization section for  updates.  Routine labs and screening tests ordered including cmp, cbc and lipids where appropriate.  Discussed recommendations regarding Vit D and calcium supplementation (see AVS)  rec f/u with Dr. Ronnald Ramp for mole check  Rec eye exam  rec living will and advanced directives  Follow up: Return in about 1 year (around 05/08/2019) for complete physical.  Lifestyle: Body mass index is 33.55 kg/m. Wt Readings from Last 3 Encounters:  05/07/18 201 lb 9.6 oz (91.4 kg)  01/16/18 175 lb (79.4 kg)  06/11/17 188 lb 12.8 oz (85.6 kg)   Diet: low fat Exercise: frequently,    Patient Active Problem List   Diagnosis Date Noted  . 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 Fibromyalgia 05/07/2018  . Chronic left sacroiliac joint pain 05/07/2018  . Obesity (BMI 30-39.9) 05/07/2018  . Arthritis of first MTP joint 04/28/2012   Health Maintenance  Topic Date Due  . HIV Screening  08/30/1980  . MAMMOGRAM  08/31/1983  . TETANUS/TDAP  08/30/1984  . COLONOSCOPY  08/31/2015  . INFLUENZA VACCINE  07/10/2018   Immunization History  Administered Date(s) Administered  . Influenza Split 08/30/2011, 08/24/2014  . Influenza-Unspecified 08/10/2016   We updated and reviewed the patient's past history in detail and it is documented below. Allergies: Patient is allergic to codeine; latex; and tetracyclines & related. Past Medical History Patient  has a past medical history of Arthritis of foot, Fibromyalgia, GERD (gastroesophageal reflux disease), Herpes labialis, High cholesterol, Iron deficiency anemia, Left shoulder pain, Low back pain, Menorrhagia, and  Ovarian cyst. Past Surgical History Patient  has a past surgical history that includes Appendectomy; Wrist ganglion excision; Dilatation & curettage/hysteroscopy with trueclear (N/A, 03/19/2014); pyloric stenosis repair (infant); Ovarian cyst removal; Laparoscopic vaginal hysterectomy with salpingo oophorectomy (Bilateral, 06/11/2017); and Cystoscopy (N/A,  06/11/2017). Family History: Patient family history includes Alcohol abuse in her father and sister; Arthritis in her mother; Asthma in her brother and mother; Bipolar disorder in her sister; COPD in her mother; Cancer in her paternal grandfather; Colon polyps in her sister; GER disease in her daughter and son; Heart disease in her maternal grandmother; Heart disease (age of onset: 87) in her father; Heart disease (age of onset: 51) in her paternal grandmother; Hyperlipidemia in her maternal grandmother and maternal uncle; Hypertension in her father; Irritable bowel syndrome in her sister; Mental illness in her maternal aunt; Rheum arthritis in her mother; Stroke in her maternal grandmother. Social History:  Patient  reports that she has quit smoking. Her smoking use included cigarettes. She has a 8.00 pack-year smoking history. She has never used smokeless tobacco. She reports that she does not drink alcohol or use drugs.  Review of Systems: Constitutional: negative for fever or malaise Ophthalmic: negative for photophobia, double vision or loss of vision Cardiovascular: negative for chest pain, dyspnea on exertion, or new LE swelling Respiratory: negative for SOB or persistent cough Gastrointestinal: negative for abdominal pain, change in bowel habits or melena Genitourinary: negative for dysuria or gross hematuria, no abnormal uterine bleeding or disharge Musculoskeletal: negative for new gait disturbance or muscular weakness Integumentary: negative for new or persistent rashes, no breast lumps Neurological: negative for TIA or stroke symptoms Psychiatric: negative for SI or delusions Allergic/Immunologic: negative for hives  Patient Care Team    Relationship Specialty Notifications Start End  Leamon Arnt, MD PCP - General Family Medicine  05/07/18   Jarome Matin, MD Consulting Physician Dermatology  05/07/18   Thurman Coyer, DO Consulting Physician Sports Medicine  05/07/18      Objective  Vitals: BP 136/90   Pulse 82   Temp 98.2 F (36.8 C)   Ht 5\' 5"  (1.651 m)   Wt 201 lb 9.6 oz (91.4 kg)   LMP 03/26/2017   BMI 33.55 kg/m  General:  Well developed, well nourished, no acute distress  Psych:  Alert and orientedx3,normal mood and affect HEENT:  Normocephalic, atraumatic, non-icteric sclera, PERRL, oropharynx is clear without mass or exudate, supple neck without adenopathy, mass or thyromegaly Cardiovascular:  Normal S1, S2, RRR without gallop, rub or murmur, nondisplaced PMI Respiratory:  Good breath sounds bilaterally, CTAB with normal respiratory effort Gastrointestinal: normal bowel sounds, soft, non-tender, no noted masses. No HSM MSK: no deformities, contusions. Joints are without erythema or swelling. Spine and CVA region are nontender Skin:  Warm, several irregular moles with color variation on the upper back Neurologic:    Mental status is normal. CN 2-11 are normal. Gross motor and sensory exams are normal. Normal gait. No tremor Breast Exam: No mass, skin retraction or nipple discharge is appreciated in either breast. No axillary adenopathy. Fibrocystic changes are not noted  Commons side effects, risks, benefits, and alternatives for medications and treatment plan prescribed today were discussed, and the patient expressed understanding of the given instructions. Patient is instructed to call or message via MyChart if he/she has any questions or concerns regarding our treatment plan. No barriers to understanding were identified. We discussed Red Flag symptoms and signs in detail. Patient expressed understanding regarding what to  do in case of urgent or emergency type symptoms.   Medication list was reconciled, printed and provided to the patient in AVS. Patient instructions and summary information was reviewed with the patient as documented in the AVS. This note was prepared with assistance of Dragon voice recognition software. Occasional wrong-word or  sound-a-like substitutions may have occurred due to the inherent limitations of voice recognition software

## 2018-05-08 LAB — HIV ANTIBODY (ROUTINE TESTING W REFLEX): HIV: NONREACTIVE

## 2018-05-30 ENCOUNTER — Ambulatory Visit: Payer: 59

## 2018-06-09 ENCOUNTER — Encounter: Payer: Self-pay | Admitting: Gastroenterology

## 2018-06-30 ENCOUNTER — Ambulatory Visit: Payer: 59

## 2018-07-28 ENCOUNTER — Ambulatory Visit
Admission: RE | Admit: 2018-07-28 | Discharge: 2018-07-28 | Disposition: A | Discharge status: Home / Self Care | Payer: 59 | Source: Ambulatory Visit | Attending: Family Medicine | Admitting: Family Medicine

## 2018-07-28 DIAGNOSIS — Z1239 Encounter for other screening for malignant neoplasm of breast: Secondary | ICD-10-CM

## 2018-07-28 DIAGNOSIS — Z1231 Encounter for screening mammogram for malignant neoplasm of breast: Secondary | ICD-10-CM | POA: Diagnosis not present

## 2018-07-29 ENCOUNTER — Ambulatory Visit (AMBULATORY_SURGERY_CENTER): Payer: Self-pay

## 2018-07-29 ENCOUNTER — Other Ambulatory Visit: Payer: Self-pay

## 2018-07-29 VITALS — Ht 65.0 in | Wt 202.4 lb

## 2018-07-29 DIAGNOSIS — Z1211 Encounter for screening for malignant neoplasm of colon: Secondary | ICD-10-CM

## 2018-07-29 MED ORDER — NA SULFATE-K SULFATE-MG SULF 17.5-3.13-1.6 GM/177ML PO SOLN: 1.0000 | Freq: Once | ORAL | 0 refills | Status: AC

## 2018-07-29 NOTE — Progress Notes (Signed)
No egg or soy allergy known to patient  No issues with past sedation with any surgeries  or procedures, no intubation problems  No diet pills per patient No home 02 use per patient  No blood thinners per patient  Pt has  Constipation, will begin taking miralax   No A fib or A flutter  EMMI video sent to pt's e mail

## 2018-07-30 ENCOUNTER — Encounter: Payer: Self-pay | Admitting: Gastroenterology

## 2018-07-30 ENCOUNTER — Other Ambulatory Visit: Payer: Self-pay | Admitting: Family Medicine

## 2018-07-30 DIAGNOSIS — R928 Other abnormal and inconclusive findings on diagnostic imaging of breast: Secondary | ICD-10-CM

## 2018-08-04 ENCOUNTER — Ambulatory Visit: Payer: 59

## 2018-08-04 ENCOUNTER — Ambulatory Visit
Admission: RE | Admit: 2018-08-04 | Discharge: 2018-08-04 | Disposition: A | Discharge status: Home / Self Care | Payer: 59 | Source: Ambulatory Visit | Attending: Family Medicine | Admitting: Family Medicine

## 2018-08-04 ENCOUNTER — Other Ambulatory Visit: Payer: Self-pay | Admitting: Family Medicine

## 2018-08-04 DIAGNOSIS — N6320 Unspecified lump in the left breast, unspecified quadrant: Secondary | ICD-10-CM

## 2018-08-04 DIAGNOSIS — N6322 Unspecified lump in the left breast, upper inner quadrant: Secondary | ICD-10-CM | POA: Diagnosis not present

## 2018-08-04 DIAGNOSIS — R928 Other abnormal and inconclusive findings on diagnostic imaging of breast: Secondary | ICD-10-CM | POA: Diagnosis not present

## 2018-08-04 DIAGNOSIS — N6321 Unspecified lump in the left breast, upper outer quadrant: Secondary | ICD-10-CM | POA: Diagnosis not present

## 2018-08-05 ENCOUNTER — Ambulatory Visit
Admission: RE | Admit: 2018-08-05 | Discharge: 2018-08-05 | Disposition: A | Discharge status: Home / Self Care | Payer: 59 | Source: Ambulatory Visit | Attending: Family Medicine | Admitting: Family Medicine

## 2018-08-05 ENCOUNTER — Other Ambulatory Visit: Payer: 59

## 2018-08-05 DIAGNOSIS — N6320 Unspecified lump in the left breast, unspecified quadrant: Secondary | ICD-10-CM

## 2018-08-05 DIAGNOSIS — D242 Benign neoplasm of left breast: Secondary | ICD-10-CM | POA: Diagnosis not present

## 2018-08-05 DIAGNOSIS — N6321 Unspecified lump in the left breast, upper outer quadrant: Secondary | ICD-10-CM | POA: Diagnosis not present

## 2018-08-05 DIAGNOSIS — N6322 Unspecified lump in the left breast, upper inner quadrant: Secondary | ICD-10-CM | POA: Diagnosis not present

## 2018-08-12 ENCOUNTER — Encounter: Payer: Self-pay | Admitting: Gastroenterology

## 2018-08-12 ENCOUNTER — Ambulatory Visit (AMBULATORY_SURGERY_CENTER): Payer: 59 | Admitting: Gastroenterology

## 2018-08-12 VITALS — BP 110/58 | HR 78 | Temp 98.9°F | Resp 24 | Ht 65.0 in | Wt 202.0 lb

## 2018-08-12 DIAGNOSIS — Z1211 Encounter for screening for malignant neoplasm of colon: Secondary | ICD-10-CM

## 2018-08-12 MED ORDER — SODIUM CHLORIDE 0.9 % IV SOLN: 500.0000 mL | Freq: Once | INTRAVENOUS | Status: DC

## 2018-08-12 NOTE — Op Note (Signed)
[facility] Patient Name: Sharon Simpson Procedure Date: 08/12/2018 9:24 AM MRN: [id number] Endoscopist: Remo Lipps P. Havery Moros , MD Age: 52 Referring MD:  Date of Birth: Oct 05, 1966 Gender: Female Account #: [id number] Procedure:                Colonoscopy Indications:              Screening for colorectal malignant neoplasm, This                            is the patient's first colonoscopy Medicines:                Monitored Anesthesia Care Procedure:                Pre-Anesthesia Assessment:                           - Prior to the procedure, a History and Physical                            was performed, and patient medications and                            allergies were reviewed. The patient's tolerance of                            previous anesthesia was also reviewed. The risks                            and benefits of the procedure and the sedation                            options and risks were discussed with the patient.                            All questions were answered, and informed consent                            was obtained. Prior Anticoagulants: The patient has                            taken no previous anticoagulant or antiplatelet                            agents. ASA Grade Assessment: II - A patient with                            mild systemic disease. After reviewing the risks                            and benefits, the patient was deemed in                            satisfactory condition to undergo the procedure.  After obtaining informed consent, the colonoscope                            was passed under direct vision. Throughout the                            procedure, the patient's blood pressure, pulse, and                            oxygen saturations were monitored continuously. The                            Model PCF-[id number]) scope was introduced                            through the anus  and advanced to the the cecum,                            identified by appendiceal orifice and ileocecal                            valve. The colonoscopy was performed without                            difficulty. The patient tolerated the procedure                            well. The quality of the bowel preparation was                            good. The ileocecal valve, appendiceal orifice, and                            rectum were photographed. Scope In: 9:31:27 AM Scope Out: 9:52:34 AM Scope Withdrawal Time: 0 hours 15 minutes 48 seconds  Total Procedure Duration: 0 hours 21 minutes 7 seconds  Findings:                 Skin tags were found on perianal exam.                           Diffuse mild melanosis was found in the entire                            colon.                           Internal hemorrhoids were found during retroflexion.                           The exam was otherwise without abnormality. No                            polyps. Complications:            No immediate complications. Estimated blood loss:  None. Estimated Blood Loss:     Estimated blood loss: none. Impression:               - Perianal skin tags found on perianal exam.                           - Melanosis in the colon.                           - Internal hemorrhoids.                           - The examination was otherwise normal.                           - No polyps. Recommendation:           - Patient has a contact number available for                            emergencies. The signs and symptoms of potential                            delayed complications were discussed with the                            patient. Return to normal activities tomorrow.                            Written discharge instructions were provided to the                            patient.                           - Resume previous diet.                           - Continue present  medications.                           - Repeat colonoscopy in 10 years for screening                            purposes. Remo Lipps P. Dorene Bruni, MD 08/12/2018 9:56:42 AM This report has been signed electronically.

## 2018-08-12 NOTE — Progress Notes (Signed)
A and O x3. Report to RN. Tolerated MAC anesthesia well.

## 2018-08-12 NOTE — Patient Instructions (Addendum)
  Thank you for allowing [place] to care for you today!  Resume previous diets and medications.  Return to normal activity tomorrow.  Repeat colonoscopy in 10 years.    YOU HAD AN ENDOSCOPIC PROCEDURE TODAY AT [facility]:   Refer to the procedure report that was given to you for any specific questions about what was found during the examination.  If the procedure report does not answer your questions, please call your gastroenterologist to clarify.  If you requested that your care partner not be given the details of your procedure findings, then the procedure report has been included in a sealed envelope for you to review at your convenience later.  YOU SHOULD EXPECT: Some feelings of bloating in the abdomen. Passage of more gas than usual.  Walking can help get rid of the air that was put into your GI tract during the procedure and reduce the bloating. If you had a lower endoscopy (such as a colonoscopy or flexible sigmoidoscopy) you may notice spotting of blood in your stool or on the toilet paper. If you underwent a bowel prep for your procedure, you may not have a normal bowel movement for a few days.  Please Note:  You might notice some irritation and congestion in your nose or some drainage.  This is from the oxygen used during your procedure.  There is no need for concern and it should clear up in a day or so.  SYMPTOMS TO REPORT IMMEDIATELY:   Following lower endoscopy (colonoscopy or flexible sigmoidoscopy):  Excessive amounts of blood in the stool  Significant tenderness or worsening of abdominal pains  Swelling of the abdomen that is new, acute  Fever of 100F or higher   For urgent or emergent issues, a gastroenterologist can be reached at any hour by calling [phone].   DIET:  We do recommend a small meal at first, but then you may proceed to your regular diet.  Drink plenty of fluids but you should avoid alcoholic beverages for 24 hours.  ACTIVITY:  You  should plan to take it easy for the rest of today and you should NOT DRIVE or use heavy machinery until tomorrow (because of the sedation medicines used during the test).    FOLLOW UP: Our staff will call the number listed on your records the next business day following your procedure to check on you and address any questions or concerns that you may have regarding the information given to you following your procedure. If we do not reach you, we will leave a message.  However, if you are feeling well and you are not experiencing any problems, there is no need to return our call.  We will assume that you have returned to your regular daily activities without incident.  If any biopsies were taken you will be contacted by phone or by letter within the next 1-3 weeks.  Please call us at [phone] if you have not heard about the biopsies in 3 weeks.    SIGNATURES/CONFIDENTIALITY: You and/or your care partner have signed paperwork which will be entered into your electronic medical record.  These signatures attest to the fact that that the information above on your After Visit Summary has been reviewed and is understood.  Full responsibility of the confidentiality of this discharge information lies with you and/or your care-partner.

## 2018-08-13 ENCOUNTER — Telehealth: Payer: Self-pay

## 2018-08-13 ENCOUNTER — Telehealth: Payer: Self-pay | Admitting: *Deleted

## 2018-08-13 NOTE — Telephone Encounter (Signed)
  Left message Sharon Simpson/Call-back

## 2018-08-13 NOTE — Telephone Encounter (Signed)
No answer second call.  Left message to call if questions or concerns.

## 2018-11-25 DIAGNOSIS — H52223 Regular astigmatism, bilateral: Secondary | ICD-10-CM | POA: Diagnosis not present

## 2018-11-25 DIAGNOSIS — H5203 Hypermetropia, bilateral: Secondary | ICD-10-CM | POA: Diagnosis not present

## 2018-11-25 DIAGNOSIS — H524 Presbyopia: Secondary | ICD-10-CM | POA: Diagnosis not present

## 2018-12-30 DIAGNOSIS — H00015 Hordeolum externum left lower eyelid: Secondary | ICD-10-CM | POA: Diagnosis not present

## 2018-12-30 DIAGNOSIS — H00035 Abscess of left lower eyelid: Secondary | ICD-10-CM | POA: Diagnosis not present

## 2019-05-08 ENCOUNTER — Ambulatory Visit: Payer: 59 | Admitting: Family Medicine

## 2019-05-15 ENCOUNTER — Encounter: Payer: Self-pay | Admitting: Family Medicine

## 2019-05-15 ENCOUNTER — Other Ambulatory Visit: Payer: Self-pay

## 2019-05-15 ENCOUNTER — Ambulatory Visit: Payer: Self-pay

## 2019-05-15 ENCOUNTER — Ambulatory Visit (INDEPENDENT_AMBULATORY_CARE_PROVIDER_SITE_OTHER): Payer: 59 | Admitting: Family Medicine

## 2019-05-15 DIAGNOSIS — M79671 Pain in right foot: Secondary | ICD-10-CM | POA: Diagnosis not present

## 2019-05-15 DIAGNOSIS — M79672 Pain in left foot: Secondary | ICD-10-CM | POA: Insufficient documentation

## 2019-05-15 MED ORDER — DICLOFENAC SODIUM 1 % TD GEL: 4.0000 g | Freq: Four times a day (QID) | TRANSDERMAL | 6 refills | Status: DC | PRN

## 2019-05-15 NOTE — Progress Notes (Signed)
Office Visit Note   Patient: Sharon Simpson           Date of Birth: [birth year]           MRN: [id number] Visit Date: 05/15/2019 Requested by: Leamon Arnt, [place], [place] [address] PCP: Leamon Arnt, MD  Subjective: Chief Complaint  Patient presents with  . Left Ankle - Pain    Lump on Achilles tendon x 2 months. Gets bigger with more walking. Has to get off her feet and ice the area after walks. Has had issues with heel pain x months.    HPI: She is here with left heel and right foot pain.  A couple months ago she started having pain in the left Achilles area with walking.  Then she developed a lump which gets bigger if she walks longer distances and really starts to hurt.  She uses ice which helps, she has not made any changes in her shoes yet.  She has chronic pain in her right foot.  She has a knot on top of her first MTP joint.  This makes her walk with a limp frequently and she wonders whether it might be causing her left heel problems.               ROS: No fevers or chills.  All other systems were reviewed and are negative.  Objective: Vital Signs: LMP 03/26/2017   Physical Exam:  General:  Alert and oriented, in no acute distress. Pulm:  Breathing unlabored. Psy:  Normal mood, congruent affect. Skin: No rash on her skin. Left heel: She has focal swelling left nodule at the distal Achilles just above the calcaneus.  There is no palpable defect in the tendon.  She does have some pain with plantarflexion against resistance.  Minimal tenderness to palpation of the medial side of the posterior calcaneus. Right foot: She has a probable dorsal spur at the distal first metatarsal at the MTP joint.  Dorsiflexion is limited to about 50 degrees.  She has pain with passive forced dorsiflexion.  Imaging: Limited diagnostic ultrasound left Achilles: The Achilles tendon itself looks normal, no significant tendinopathy or partial tears.  There is  focal thickening of the tendon sheath in the region of her pain just proximal to the calcaneus.  Minimal hyperemia on power Doppler imaging.  She does have an insertional spur at the calcaneus.  X-rays right foot:  1st MTP DJD, moderate, with dorsal 1st MT spur.    Assessment & Plan: 1.  Left heel Achilles tenosynovitis -Voltaren gel, physical therapy iontophoresis.  Follow-up as needed.  2.  Chronic right foot pain with hallux rigidus and dorsal first metatarsal spur -I will ask Dr. Marlou Sa to evaluate her for possible cheilectomy.    Procedures: No procedures performed  No notes on file     PMFS History: Patient Active Problem List   Diagnosis Date Noted  . 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 Fibromyalgia 05/07/2018  . Chronic left sacroiliac joint pain 05/07/2018  . Obesity (BMI 30-39.9) 05/07/2018  . 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 Arthritis of foot    R first MTP (xray 04/2012)  . Fibromyalgia   . GERD (gastroesophageal reflux disease)    diet controlled - no meds  . Herpes labialis    fever blisters when in the sun  . High cholesterol    per pt--improved with diet/exercise  .  Hypotension   . Iron deficiency anemia   . Left shoulder pain   . Low back pain    sees Dr. Lynann Bologna  . Menorrhagia    Dr. Toney Rakes  . Ovarian cyst    (surgery age 53)    Family History  Problem Relation Age of Onset  . Rheum arthritis Mother   . Arthritis Mother   . COPD Mother   . Asthma Mother        child  . Alcohol abuse Father   . Heart disease Father 43  . Hypertension Father   . Irritable bowel syndrome Sister   . Bipolar disorder Sister   . Alcohol abuse Sister   . Colon polyps Sister        teenager  . Asthma Brother   . Mental illness Maternal Aunt        fixed delusions  . Hyperlipidemia Maternal Uncle   . Hyperlipidemia Maternal Grandmother   . Heart disease Maternal Grandmother        MI  . Stroke Maternal  Grandmother   . Heart disease Paternal Grandmother 63       MI  . Cancer Paternal Grandfather        lung cancer  . GER disease Daughter   . GER disease Son   . Diabetes Neg Hx   . Breast cancer Neg Hx   . Colon cancer Neg Hx   . Esophageal cancer Neg Hx   . Rectal cancer Neg Hx   . Stomach cancer Neg Hx     Past Surgical History:  Procedure Laterality Date  . APPENDECTOMY     53 YR OLD  . CYSTOSCOPY N/A 06/11/2017   Procedure: CYSTOSCOPY;  Surgeon: Anastasio Auerbach, MD;  Location: [facility];  Service: Gynecology;  Laterality: N/A;  . DILATATION & CURETTAGE/HYSTEROSCOPY WITH TRUECLEAR N/A 03/19/2014   Procedure: DILATATION & CURETTAGE/HYSTEROSCOPY WITH TRUCLEAR;  Surgeon: Terrance Mass, MD;  Location: [facility];  Service: Gynecology;  Laterality: N/A;  Resectoscopic Polypectomy  . LAPAROSCOPIC VAGINAL HYSTERECTOMY WITH SALPINGO OOPHORECTOMY Bilateral 06/11/2017   Procedure: LAPAROSCOPIC ASSISTED VAGINAL HYSTERECTOMY WITH SALPINGO OOPHORECTOMY;  Surgeon: Anastasio Auerbach, MD;  Location: [facility];  Service: Gynecology;  Laterality: Bilateral;  . OVARIAN CYST REMOVAL     laparotomy  . pyloric stenosis repair  infant  . WRIST GANGLION EXCISION     Social History   Occupational History  . 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 Smoking status: Former Smoker    Packs/day: 1.00    Years: 8.00    Pack years: 8.00    Types: Cigarettes  . Smokeless tobacco: Never Used  . Tobacco comment: quit age 27 (from age [age], 1 PPD)  Substance and Sexual Activity  . Alcohol use: No    Alcohol/week: 0.0 standard drinks  . Drug use: No  . Sexual activity: Yes    Partners: Male    Birth control/protection: Surgical    Comment: VASECTOMY

## 2019-05-21 ENCOUNTER — Telehealth: Payer: 59 | Admitting: Physician Assistant

## 2019-05-21 DIAGNOSIS — J019 Acute sinusitis, unspecified: Secondary | ICD-10-CM

## 2019-05-21 DIAGNOSIS — B9689 Other specified bacterial agents as the cause of diseases classified elsewhere: Secondary | ICD-10-CM | POA: Diagnosis not present

## 2019-05-21 MED ORDER — AMOXICILLIN-POT CLAVULANATE 875-125 MG PO TABS: 1.0000 | ORAL_TABLET | Freq: Two times a day (BID) | ORAL | 0 refills | Status: DC

## 2019-05-21 NOTE — Progress Notes (Signed)

## 2019-05-21 NOTE — Progress Notes (Signed)
I have spent 5 minutes in review of e-visit questionnaire, review and updating patient chart, medical decision making and response to patient.   Jevaughn Degollado Cody Tracy Kinner, PA-C    

## 2019-05-22 ENCOUNTER — Other Ambulatory Visit: Payer: Self-pay

## 2019-05-22 ENCOUNTER — Encounter: Payer: Self-pay | Admitting: Orthopedic Surgery

## 2019-05-22 ENCOUNTER — Ambulatory Visit (INDEPENDENT_AMBULATORY_CARE_PROVIDER_SITE_OTHER): Payer: 59 | Admitting: Orthopedic Surgery

## 2019-05-22 DIAGNOSIS — M79671 Pain in right foot: Secondary | ICD-10-CM

## 2019-05-22 NOTE — Progress Notes (Signed)
Office Visit Note   Patient: Sharon Simpson           Date of Birth: March 17, 1966           MRN: [id number] Visit Date: 05/22/2019 Requested by: Leamon Arnt, [place],  [place] [address] PCP: Leamon Arnt, MD  Subjective: Chief Complaint  Patient presents with  . Right Foot - Pain    HPI: Sharon Simpson is a patient with right foot pain.  She seen Dr. Junius Roads before for left foot Achilles tendinosis.  Now she is having right foot MTP symptoms.  Outside radiographs are reviewed and she does have a small to medium size spur on the dorsal aspect of that metatarsal head.  Pain comes and goes.  She is able to walk but she is can get an exercise bike for fitness.  She is using some topical cream on that right toe.  We talked about using oral corticosteroids but I think that would be a short-term solution if she has a flare.  Currently she is able to manage with the foot the way it is.  Her left Achilles is actually hurting her little bit more than that right great toe.              ROS: All systems reviewed are negative as they relate to the chief complaint within the history of present illness.  Patient denies  fevers or chills.   Assessment & Plan: Visit Diagnoses:  1. Pain in right foot     Plan: Impression is right foot pain with MTP arthritis which is fairly severe radiographically but the spur size is on the medium side.  She still has about 20 degrees of toe dorsiflexion past neutral although it is painful at the ends.  Plantarflexion of that toe is about 30 degrees.  Plan at this time is observation.  I would like her to start and continue to do range of motion exercises for that toe as much as possible.  She is going to avoid the loading exercises.  We will see her back in about 4 months for clinical recheck  Follow-Up Instructions: Return in about 4 months (around 09/21/2019).   Orders:  No orders of the defined types were placed in this encounter.  No  orders of the defined types were placed in this encounter.     Procedures: No procedures performed   Clinical Data: No additional findings.  Objective: Vital Signs: LMP 03/26/2017   Physical Exam:   Constitutional: Patient appears well-developed HEENT:  Head: Normocephalic Eyes:EOM are normal Neck: Normal range of motion Cardiovascular: Normal rate Pulmonary/chest: Effort normal Neurologic: Patient is alert Skin: Skin is warm Psychiatric: Patient has normal mood and affect    Ortho Exam: Ortho exam demonstrates full active and passive range of motion of the ankle on the right-hand side.  She does have a small area of tendinosis on that left Achilles tendon in the watershed area laterally.  Measures about 1 cm maximum.  On that toe she has about 20 degrees of dorsiflexion on the right toe and 30 degrees of plantarflexion.  Does have some arthritis and grinding at the extent of max dorsiflexion.  Negative Tinel's over that spur on the internal medial aspect of the metatarsal head.  Specialty Comments:  No specialty comments available.  Imaging: No results found.   PMFS History: Patient Active Problem List   Diagnosis Date Noted  . 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 Fibromyalgia  05/07/2018  . Chronic left sacroiliac joint pain 05/07/2018  . Obesity (BMI 30-39.9) 05/07/2018  . 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 Arthritis of foot    R first MTP (xray 04/2012)  . Fibromyalgia   . GERD (gastroesophageal reflux disease)    diet controlled - no meds  . Herpes labialis    fever blisters when in the sun  . High cholesterol    per pt--improved with diet/exercise  . Hypotension   . Iron deficiency anemia   . Left shoulder pain   . Low back pain    sees Dr. Lynann Bologna  . Menorrhagia    Dr. Toney Rakes  . Ovarian cyst    (surgery age 89)    Family History  Problem Relation Age of Onset  . Rheum arthritis Mother   .  Arthritis Mother   . COPD Mother   . Asthma Mother        child  . Alcohol abuse Father   . Heart disease Father 64  . Hypertension Father   . Irritable bowel syndrome Sister   . Bipolar disorder Sister   . Alcohol abuse Sister   . Colon polyps Sister        teenager  . Asthma Brother   . Mental illness Maternal Aunt        fixed delusions  . Hyperlipidemia Maternal Uncle   . Hyperlipidemia Maternal Grandmother   . Heart disease Maternal Grandmother        MI  . Stroke Maternal Grandmother   . Heart disease Paternal Grandmother 61       MI  . Cancer Paternal Grandfather        lung cancer  . GER disease Daughter   . GER disease Son   . Diabetes Neg Hx   . Breast cancer Neg Hx   . Colon cancer Neg Hx   . Esophageal cancer Neg Hx   . Rectal cancer Neg Hx   . Stomach cancer Neg Hx     Past Surgical History:  Procedure Laterality Date  . APPENDECTOMY     53 YR OLD  . CYSTOSCOPY N/A 06/11/2017   Procedure: CYSTOSCOPY;  Surgeon: Anastasio Auerbach, MD;  Location: [facility];  Service: Gynecology;  Laterality: N/A;  . DILATATION & CURETTAGE/HYSTEROSCOPY WITH TRUECLEAR N/A 03/19/2014   Procedure: DILATATION & CURETTAGE/HYSTEROSCOPY WITH TRUCLEAR;  Surgeon: Terrance Mass, MD;  Location: [facility];  Service: Gynecology;  Laterality: N/A;  Resectoscopic Polypectomy  . LAPAROSCOPIC VAGINAL HYSTERECTOMY WITH SALPINGO OOPHORECTOMY Bilateral 06/11/2017   Procedure: LAPAROSCOPIC ASSISTED VAGINAL HYSTERECTOMY WITH SALPINGO OOPHORECTOMY;  Surgeon: Anastasio Auerbach, MD;  Location: [facility];  Service: Gynecology;  Laterality: Bilateral;  . OVARIAN CYST REMOVAL     laparotomy  . pyloric stenosis repair  infant  . WRIST GANGLION EXCISION     Social History   Occupational History  . 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 Smoking status: Former Smoker    Packs/day: 1.00    Years: 8.00    Pack years: 8.00    Types: Cigarettes   . Smokeless tobacco: Never Used  . Tobacco comment: quit age 76 (from age [age], 1 PPD)  Substance and Sexual Activity  . Alcohol use: No    Alcohol/week: 0.0 standard drinks  . Drug use: No  . Sexual activity: Yes    Partners: Male  Birth control/protection: Surgical    Comment: VASECTOMY

## 2019-06-09 ENCOUNTER — Telehealth: Payer: Self-pay | Admitting: *Deleted

## 2019-06-09 NOTE — Telephone Encounter (Signed)
Patient would like to transfer care from Dr. Jonni Sanger  to Elyn Aquas at the [place] office.   Routing to providers for approval.

## 2019-06-09 NOTE — Telephone Encounter (Signed)
Sounds good. Thanks 

## 2019-06-09 NOTE — Telephone Encounter (Signed)
Patient has been scheduled an appointment for next week.

## 2019-06-09 NOTE — Telephone Encounter (Signed)
Ok with me 

## 2019-06-19 ENCOUNTER — Encounter: Payer: 59 | Admitting: Physician Assistant

## 2019-06-21 ENCOUNTER — Other Ambulatory Visit: Payer: Self-pay

## 2019-06-21 ENCOUNTER — Ambulatory Visit (HOSPITAL_COMMUNITY)
Admission: EM | Admit: 2019-06-21 | Discharge: 2019-06-21 | Disposition: A | Discharge status: Home / Self Care | Payer: 59 | Attending: Physician Assistant | Admitting: Physician Assistant

## 2019-06-21 ENCOUNTER — Encounter (HOSPITAL_COMMUNITY): Payer: Self-pay

## 2019-06-21 DIAGNOSIS — H02845 Edema of left lower eyelid: Secondary | ICD-10-CM

## 2019-06-21 MED ORDER — FLUTICASONE PROPIONATE 50 MCG/ACT NA SUSP: 2.0000 | Freq: Every day | NASAL | 0 refills | Status: AC

## 2019-06-21 MED ORDER — MUPIROCIN 2 % EX OINT: 1.0000 "application " | TOPICAL_OINTMENT | Freq: Two times a day (BID) | CUTANEOUS | 0 refills | Status: DC

## 2019-06-21 MED ORDER — AMOXICILLIN-POT CLAVULANATE 875-125 MG PO TABS: 1.0000 | ORAL_TABLET | Freq: Two times a day (BID) | ORAL | 0 refills | Status: DC

## 2019-06-21 NOTE — Discharge Instructions (Signed)
No alarming signs at this time. Start bactroban on affected area. Ice compress to the swelling. Monitor for any hygiene product change that could be causing symptoms. If noticing increased swelling, redness, warmth, start augmentin to cover for skin infection/sinus infection. If noticing small vesicular rash, will need to follow up for possible shingles. If redness is spreading and now with significant swelling to the eye causing you to not be able to open your eye, having painful eye movement, sensitivity to light, follow up with ophthalmology for further evaluation needed.

## 2019-06-21 NOTE — ED Triage Notes (Signed)
Pt present right eye swelling. Symptoms started today. Pt states it hurt when she blink her eye and some pressure to her eye.

## 2019-06-21 NOTE — ED Provider Notes (Signed)
[facility]    CSN: [id number] Arrival date & time: 06/21/19  1615     History   Chief Complaint Chief Complaint  Patient presents with  . Eye Problem    Right    HPI Sharon Simpson is a 53 y.o. female.   53 year old female comes in for 1 to 2-day history of right lower eyelid swelling.  States noticed a small red mark to the cheek a few days ago, that area has slightly spread and is tender to palpation.  Noticed today that she has swelling to the erythematous area, and swelling extends to the right lower eyelid.  This is causing her to have frequent blinking, blurry vision, pressure to the eye.  States blurry vision resolves after blinking.  Denies photophobia.  Denies eye drainage.  Denies contact lens use.  Denies burning sensation, vesicular rash. She has not had any hygiene product changes.  She does not notes that she has frequent sinus infections, and having pressure to the maxillary sinus area, and wonders if that could be causing symptoms.  Denies fever, chills, night sweats.     Past Medical History:  Diagnosis Date  . Arthritis of foot    R first MTP (xray 04/2012)  . Fibromyalgia   . GERD (gastroesophageal reflux disease)    diet controlled - no meds  . Herpes labialis    fever blisters when in the sun  . High cholesterol    per pt--improved with diet/exercise  . Hypotension   . Iron deficiency anemia   . Left shoulder pain   . Low back pain    sees Dr. Lynann Bologna  . Menorrhagia    Dr. Toney Rakes  . Ovarian cyst    (surgery age 13)    Patient Active Problem List   Diagnosis Date Noted  . 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 Fibromyalgia 05/07/2018  . Chronic left sacroiliac joint pain 05/07/2018  . Obesity (BMI 30-39.9) 05/07/2018  . Arthritis of first MTP joint 04/28/2012    Past Surgical History:  Procedure Laterality Date  . APPENDECTOMY     53 YR OLD  . CYSTOSCOPY N/A 06/11/2017   Procedure: CYSTOSCOPY;  Surgeon: Anastasio Auerbach,  MD;  Location: [facility];  Service: Gynecology;  Laterality: N/A;  . DILATATION & CURETTAGE/HYSTEROSCOPY WITH TRUECLEAR N/A 03/19/2014   Procedure: DILATATION & CURETTAGE/HYSTEROSCOPY WITH TRUCLEAR;  Surgeon: Terrance Mass, MD;  Location: [facility];  Service: Gynecology;  Laterality: N/A;  Resectoscopic Polypectomy  . LAPAROSCOPIC VAGINAL HYSTERECTOMY WITH SALPINGO OOPHORECTOMY Bilateral 06/11/2017   Procedure: LAPAROSCOPIC ASSISTED VAGINAL HYSTERECTOMY WITH SALPINGO OOPHORECTOMY;  Surgeon: Anastasio Auerbach, MD;  Location: [facility];  Service: Gynecology;  Laterality: Bilateral;  . OVARIAN CYST REMOVAL     laparotomy  . pyloric stenosis repair  infant  . WRIST GANGLION EXCISION      OB History    Gravida  3   Para  3   Term  3   Preterm      AB      Living  3     SAB      TAB      Ectopic      Multiple      Live Births               Home Medications    Prior to Admission medications   Medication Sig Start Date End Date Taking? Authorizing Provider  amoxicillin-clavulanate (AUGMENTIN) 875-125 MG tablet Take 1 tablet by mouth every  12 (twelve) hours. 06/21/19   Ok Edwards, PA-C  Biotin 1000 MCG CHEW Chew 1,500 mcg by mouth.    [provider]  diclofenac sodium (VOLTAREN) 1 % GEL Apply 4 g topically 4 (four) times daily as needed. 05/15/19   Hilts, Legrand Como, MD  fluticasone (FLONASE) 50 MCG/ACT nasal spray Place 2 sprays into both nostrils daily. 06/21/19   Tasia Catchings, Tata Timmins V, PA-C  Multiple Vitamins-Minerals (MULTIVITAMIN WITH MINERALS) tablet Take 1 tablet by mouth daily.    [provider]  mupirocin ointment (BACTROBAN) 2 % Apply 1 application topically 2 (two) times daily. 06/21/19   Tasia Catchings, Bonne Whack V, PA-C  Omega 3-6-9 Fatty Acids (TRIPLE OMEGA COMPLEX PO) Take 2 capsules by mouth daily.    [provider]  Probiotic Product (PROBIOTIC-10 PO) Take by mouth.    [provider]  valACYclovir (VALTREX) 1000 MG tablet Take 1,000 mg by mouth daily as needed  (fever blister). Reported on 04/18/2016    [provider]    Family History Family History  Problem Relation Age of Onset  . Rheum arthritis Mother   . Arthritis Mother   . COPD Mother   . Asthma Mother        child  . Alcohol abuse Father   . Heart disease Father 37  . Hypertension Father   . Irritable bowel syndrome Sister   . Bipolar disorder Sister   . Alcohol abuse Sister   . Colon polyps Sister        teenager  . Asthma Brother   . Mental illness Maternal Aunt        fixed delusions  . Hyperlipidemia Maternal Uncle   . Hyperlipidemia Maternal Grandmother   . Heart disease Maternal Grandmother        MI  . Stroke Maternal Grandmother   . Heart disease Paternal Grandmother 16       MI  . Cancer Paternal Grandfather        lung cancer  . GER disease Daughter   . GER disease Son   . Diabetes Neg Hx   . Breast cancer Neg Hx   . Colon cancer Neg Hx   . Esophageal cancer Neg Hx   . Rectal cancer Neg Hx   . Stomach cancer Neg Hx     Social History Social History   Tobacco Use  . Smoking status: Former Smoker    Packs/day: 1.00    Years: 8.00    Pack years: 8.00    Types: Cigarettes  . Smokeless tobacco: Never Used  . Tobacco comment: quit age 15 (from age [age], 1 PPD)  Substance Use Topics  . Alcohol use: No    Alcohol/week: 0.0 standard drinks  . Drug use: No     Allergies   Codeine, Latex, and Tetracyclines & related   Review of Systems Review of Systems  Reason unable to perform ROS: See HPI as above.     Physical Exam Triage Vital Signs ED Triage Vitals  Enc Vitals Group     BP 06/21/19 1715 (!) 119/51     Pulse Rate 06/21/19 1715 82     Resp 06/21/19 1715 16     Temp 06/21/19 1715 98.6 F (37 C)     Temp Source 06/21/19 1715 Oral     SpO2 06/21/19 1715 100 %     Weight --      Height --      Head Circumference --      Peak  Flow --      Pain Score 06/21/19 1716 4     Pain Loc --      Pain Edu? --      Excl. in [place]? --     No data found.  Updated Vital Signs BP (!) 119/51 (BP Location: Right Arm)   Pulse 82   Temp 98.6 F (37 C) (Oral)   Resp 16   LMP 03/26/2017   SpO2 100%   Physical Exam Constitutional:      General: She is not in acute distress.    Appearance: She is well-developed. She is not diaphoretic.  HENT:     Head: Normocephalic and atraumatic.      Comments: Erythema with dry scaly skin to the right cheek. No warmth, induration. Mild tenderness to palpation. No vesicular rash.  Eyes:     Extraocular Movements: Extraocular movements intact.     Conjunctiva/sclera: Conjunctivae normal.     Pupils: Pupils are equal, round, and reactive to light.     Comments: Right lower eyelid swelling without erythema, warmth. No tenderness to palpation of the eyelid.   Neurological:     Mental Status: She is alert and oriented to person, place, and time.      UC Treatments / Results  Labs (all labs ordered are listed, but only abnormal results are displayed) Labs Reviewed - No data to display  EKG   Radiology No results found.  Procedures Procedures (including critical care time)  Medications Ordered in UC Medications - No data to display  Initial Impression / Assessment and Plan / UC Course  I have reviewed the triage vital signs and the nursing notes.  Pertinent labs & imaging results that were available during my care of the patient were reviewed by me and considered in my medical decision making (see chart for details).    Lower suspicion for cellulitis at this time.  Discussed possible inflammation/irritation causing symptoms.  Start ice compress at this time.  Will provide Bactroban ointment to erythematous area.  Written Rx of Augmentin provided.  Can start if symptoms not improving to cover for sinusitis/cellulitis.  Return precautions given.  Patient expresses understanding and agrees to plan.  Final Clinical Impressions(s) / UC Diagnoses   Final diagnoses:  Swelling of  left lower eyelid   ED Prescriptions    Medication Sig Dispense Auth. Provider   mupirocin ointment (BACTROBAN) 2 % Apply 1 application topically 2 (two) times daily. 22 g Keeon Zurn V, PA-C   fluticasone (FLONASE) 50 MCG/ACT nasal spray Place 2 sprays into both nostrils daily. 1 g Knox Holdman V, PA-C   amoxicillin-clavulanate (AUGMENTIN) 875-125 MG tablet Take 1 tablet by mouth every 12 (twelve) hours. 14 tablet Tobin Chad, [place] 06/21/19 1807

## 2019-07-03 ENCOUNTER — Encounter: Payer: 59 | Admitting: Physician Assistant

## 2019-07-03 DIAGNOSIS — Z0289 Encounter for other administrative examinations: Secondary | ICD-10-CM

## 2019-09-02 ENCOUNTER — Encounter: Payer: Self-pay | Admitting: Gynecology

## 2019-09-21 ENCOUNTER — Ambulatory Visit: Payer: 59 | Admitting: Orthopedic Surgery

## 2020-03-08 ENCOUNTER — Other Ambulatory Visit: Payer: Self-pay | Admitting: Physician Assistant

## 2020-03-08 DIAGNOSIS — Z1231 Encounter for screening mammogram for malignant neoplasm of breast: Secondary | ICD-10-CM

## 2020-03-14 ENCOUNTER — Ambulatory Visit
Admission: RE | Admit: 2020-03-14 | Discharge: 2020-03-14 | Disposition: A | Discharge status: Home / Self Care | Payer: 59 | Source: Ambulatory Visit | Attending: Physician Assistant | Admitting: Physician Assistant

## 2020-03-14 ENCOUNTER — Other Ambulatory Visit: Payer: Self-pay

## 2020-03-14 DIAGNOSIS — Z1231 Encounter for screening mammogram for malignant neoplasm of breast: Secondary | ICD-10-CM | POA: Diagnosis not present

## 2020-03-21 ENCOUNTER — Ambulatory Visit (INDEPENDENT_AMBULATORY_CARE_PROVIDER_SITE_OTHER): Payer: 59 | Admitting: Physician Assistant

## 2020-03-21 ENCOUNTER — Other Ambulatory Visit: Payer: Self-pay

## 2020-03-21 ENCOUNTER — Encounter: Payer: Self-pay | Admitting: Physician Assistant

## 2020-03-21 VITALS — BP 102/64 | HR 76 | Temp 98.6°F | Resp 16 | Ht 65.0 in | Wt 198.0 lb

## 2020-03-21 DIAGNOSIS — E669 Obesity, unspecified: Secondary | ICD-10-CM

## 2020-03-21 DIAGNOSIS — H9311 Tinnitus, right ear: Secondary | ICD-10-CM | POA: Diagnosis not present

## 2020-03-21 DIAGNOSIS — Z0000 Encounter for general adult medical examination without abnormal findings: Secondary | ICD-10-CM | POA: Diagnosis not present

## 2020-03-21 DIAGNOSIS — H9191 Unspecified hearing loss, right ear: Secondary | ICD-10-CM

## 2020-03-21 DIAGNOSIS — M19079 Primary osteoarthritis, unspecified ankle and foot: Secondary | ICD-10-CM

## 2020-03-21 LAB — CBC WITH DIFFERENTIAL/PLATELET
Basophils Absolute: 0 10*3/uL (ref 0.0–0.1)
Basophils Relative: 0.7 % (ref 0.0–3.0)
Eosinophils Absolute: 0.1 10*3/uL (ref 0.0–0.7)
Eosinophils Relative: 2.1 % (ref 0.0–5.0)
HCT: 39.4 % (ref 36.0–46.0)
Hemoglobin: 13.3 g/dL (ref 12.0–15.0)
Lymphocytes Relative: 34.6 % (ref 12.0–46.0)
Lymphs Abs: 2.1 10*3/uL (ref 0.7–4.0)
MCHC: 33.7 g/dL (ref 30.0–36.0)
MCV: 86 fl (ref 78.0–100.0)
Monocytes Absolute: 0.4 10*3/uL (ref 0.1–1.0)
Monocytes Relative: 6.8 % (ref 3.0–12.0)
Neutro Abs: 3.4 10*3/uL (ref 1.4–7.7)
Neutrophils Relative %: 55.8 % (ref 43.0–77.0)
Platelets: 293 10*3/uL (ref 150.0–400.0)
RBC: 4.59 Mil/uL (ref 3.87–5.11)
RDW: 13 % (ref 11.5–15.5)
WBC: 6.1 10*3/uL (ref 4.0–10.5)

## 2020-03-21 LAB — COMPREHENSIVE METABOLIC PANEL
ALT: 36 U/L — ABNORMAL HIGH (ref 0–35)
AST: 24 U/L (ref 0–37)
Albumin: 4.1 g/dL (ref 3.5–5.2)
Alkaline Phosphatase: 47 U/L (ref 39–117)
BUN: 12 mg/dL (ref 6–23)
CO2: 27 mEq/L (ref 19–32)
Calcium: 9.3 mg/dL (ref 8.4–10.5)
Chloride: 105 mEq/L (ref 96–112)
Creatinine, Ser: 0.71 mg/dL (ref 0.40–1.20)
GFR: 85.61 mL/min (ref >60.00)
Glucose, Bld: 102 mg/dL — ABNORMAL HIGH (ref 70–99)
Potassium: 3.8 mEq/L (ref 3.5–5.1)
Sodium: 140 mEq/L (ref 135–145)
Total Bilirubin: 0.4 mg/dL (ref 0.2–1.2)
Total Protein: 6.5 g/dL (ref 6.0–8.3)

## 2020-03-21 LAB — VITAMIN B12: Vitamin B-12: 621 pg/mL (ref 211–911)

## 2020-03-21 LAB — LIPID PANEL
Cholesterol: 233 mg/dL — ABNORMAL HIGH (ref 0–200)
HDL: 44.2 mg/dL (ref >39.00)
LDL Cholesterol: 164 mg/dL — ABNORMAL HIGH (ref 0–99)
NonHDL: 188.84
Total CHOL/HDL Ratio: 5
Triglycerides: 126 mg/dL (ref 0.0–149.0)
VLDL: 25.2 mg/dL (ref 0.0–40.0)

## 2020-03-21 LAB — VITAMIN D 25 HYDROXY (VIT D DEFICIENCY, FRACTURES): VITD: 40.11 ng/mL (ref 30.00–100.00)

## 2020-03-21 LAB — TSH: TSH: 3.83 u[IU]/mL (ref 0.35–4.50)

## 2020-03-21 NOTE — Progress Notes (Signed)
Patient presents to clinic today for annual exam.  Patient is fasting for labs.  Acute Concerns: Denies acute concerns today.   Chronic Issues: Tinnitus -- Endorses longstanding history of tinnitus of R ear.  Over the past year has noted some decreased hearing in this ear.  Denies any trauma or injury or loud noise exposure. Denies R ear pain, drainage.  Denies symptoms of left ear. Denies fever, chills, malaise or fatigue. Denies history of cerumen impaction.  Denies any known history of B12 deficiency.  Health Maintenance: Immunizations --up-to-date per patient.  She will bring in copy of her immunization report to verify Tdap up-to-date. Colonoscopy -- UTD Mammogram -- UTD   Past Surgical History:  Procedure Laterality Date  . APPENDECTOMY     54 YR OLD  . BREAST BIOPSY Left 2019   fibroadenoma  . CYSTOSCOPY N/A 06/11/2017   Procedure: CYSTOSCOPY;  Surgeon: Anastasio Auerbach, MD;  Location: [facility];  Service: Gynecology;  Laterality: N/A;  . DILATATION & CURETTAGE/HYSTEROSCOPY WITH TRUECLEAR N/A 03/19/2014   Procedure: DILATATION & CURETTAGE/HYSTEROSCOPY WITH TRUCLEAR;  Surgeon: Terrance Mass, MD;  Location: [facility];  Service: Gynecology;  Laterality: N/A;  Resectoscopic Polypectomy  . LAPAROSCOPIC VAGINAL HYSTERECTOMY WITH SALPINGO OOPHORECTOMY Bilateral 06/11/2017   Procedure: LAPAROSCOPIC ASSISTED VAGINAL HYSTERECTOMY WITH SALPINGO OOPHORECTOMY;  Surgeon: Anastasio Auerbach, MD;  Location: [facility];  Service: Gynecology;  Laterality: Bilateral;  . OVARIAN CYST REMOVAL     laparotomy  . pyloric stenosis repair  infant  . WRIST GANGLION EXCISION      Current Outpatient Medications on File Prior to Visit  Medication Sig Dispense Refill  . Biotin 1000 MCG CHEW Chew 1,500 mcg by mouth.    . Multiple Vitamins-Minerals (MULTI-VITAMIN MENOPAUSAL) TABS Take by mouth. Abordine supplement for menopausal symptoms    . Multiple Vitamins-Minerals (MULTIVITAMIN WITH MINERALS) tablet  Take 1 tablet by mouth daily.    . valACYclovir (VALTREX) 1000 MG tablet Take 1,000 mg by mouth daily as needed (fever blister). Reported on 04/18/2016    . VITAMIN D-VITAMIN K PO Take 1 tablet by mouth daily.    . diclofenac sodium (VOLTAREN) 1 % GEL Apply 4 g topically 4 (four) times daily as needed. (Patient not taking: Reported on 03/21/2020) 500 g 6  . fluticasone (FLONASE) 50 MCG/ACT nasal spray Place 2 sprays into both nostrils daily. 1 g 0  . mupirocin ointment (BACTROBAN) 2 % Apply 1 application topically 2 (two) times daily. 22 g 0  . Omega 3-6-9 Fatty Acids (TRIPLE OMEGA COMPLEX PO) Take 2 capsules by mouth daily.    . Probiotic Product (PROBIOTIC-10 PO) Take by mouth.     No current facility-administered medications on file prior to visit.    Allergies  Allergen Reactions  . Codeine Rash  . Latex Rash  . Tetracyclines & Related Rash    Family History  Problem Relation Age of Onset  . Rheum arthritis Mother   . Arthritis Mother   . COPD Mother   . Asthma Mother        child  . Alcohol abuse Father   . Heart disease Father 71  . Hypertension Father   . Irritable bowel syndrome Sister   . Bipolar disorder Sister   . Alcohol abuse Sister   . Colon polyps Sister        teenager  . Asthma Brother   . Mental illness Maternal Aunt        fixed delusions  . Hyperlipidemia  Maternal Uncle   . Hyperlipidemia Maternal Grandmother   . Heart disease Maternal Grandmother        MI  . Stroke Maternal Grandmother   . Heart disease Paternal Grandmother 33       MI  . Cancer Paternal Grandfather        lung cancer  . GER disease Daughter   . GER disease Son   . Diabetes Neg Hx   . Breast cancer Neg Hx   . Colon cancer Neg Hx   . Esophageal cancer Neg Hx   . Rectal cancer Neg Hx   . Stomach cancer Neg Hx     Social History   Socioeconomic History  . Marital status: Married    Spouse name: Not on file  . Number of children: 3  . Years of education: Not on file  .  Highest education level: Not on file  Occupational History  . Occupation: Surveyor, quantity for pediatric care at [employer]; Programmer, multimedia: [employer]  Tobacco Use  . Smoking status: Former Smoker    Packs/day: 1.00    Years: 8.00    Pack years: 8.00    Types: Cigarettes  . Smokeless tobacco: Never Used  . Tobacco comment: quit age 62 (from age [age], 1 PPD)  Substance and Sexual Activity  . Alcohol use: No    Alcohol/week: 0.0 standard drinks  . Drug use: No  . Sexual activity: Yes    Partners: Male    Birth control/protection: Surgical    Comment: VASECTOMY  Other Topics Concern  . Not on file  Social History Narrative   Married.   Daughter is 3 rd physiatry resident in [place]   Son lives in [place], [place] grandchild    Youngest son is at [employer] Strain:   . Difficulty of Paying Living Expenses:   Food Insecurity:   . Worried About Charity fundraiser in the Last Year:   . Arboriculturist in the Last Year:   Transportation Needs:   . Film/video editor (Medical):   Marland Kitchen Lack of Transportation (Non-Medical):   Physical Activity:   . Days of Exercise per Week:   . Minutes of Exercise per Session:   Stress:   . Feeling of Stress :   Social Connections:   . Frequency of Communication with Friends and Family:   . Frequency of Social Gatherings with Friends and Family:   . Attends Religious Services:   . Active Member of Clubs or Organizations:   . Attends Archivist Meetings:   Marland Kitchen Marital Status:   Intimate Partner Violence:   . Fear of Current or Ex-Partner:   . Emotionally Abused:   Marland Kitchen Physically Abused:   . Sexually Abused:    Review of Systems  Constitutional: Negative for fever and weight loss.  HENT: Positive for hearing loss and tinnitus. Negative for ear discharge and ear pain.   Eyes: Negative for blurred vision, double vision, photophobia and pain.  Respiratory: Negative for cough and shortness  of breath.   Cardiovascular: Negative for chest pain and palpitations.  Gastrointestinal: Negative for abdominal pain, blood in stool, constipation, diarrhea, heartburn, melena, nausea and vomiting.  Genitourinary: Negative for dysuria, flank pain, frequency, hematuria and urgency.  Musculoskeletal: Positive for joint pain (Chronic from OA). Negative for falls.  Neurological: Negative for dizziness, loss of consciousness and headaches.  Endo/Heme/Allergies: Negative for environmental allergies.  Psychiatric/Behavioral: Negative for depression, hallucinations, substance abuse and suicidal ideas. The patient is not nervous/anxious and does not have insomnia.    Ht 5\' 5"  (1.651 m)   Wt 198 lb (89.8 kg)   LMP 03/26/2017   BMI 32.95 kg/m   Physical Exam Vitals reviewed.  HENT:     Head: Normocephalic and atraumatic.     Right Ear: Tympanic membrane, ear canal and external ear normal.     Left Ear: Tympanic membrane, ear canal and external ear normal.     Nose: Nose normal. No mucosal edema.     Mouth/Throat:     Pharynx: Uvula midline. No oropharyngeal exudate or posterior oropharyngeal erythema.  Eyes:     Conjunctiva/sclera: Conjunctivae normal.     Pupils: Pupils are equal, round, and reactive to light.  Neck:     Thyroid: No thyromegaly.  Cardiovascular:     Rate and Rhythm: Normal rate and regular rhythm.     Heart sounds: Normal heart sounds.  Pulmonary:     Effort: Pulmonary effort is normal. No respiratory distress.     Breath sounds: Normal breath sounds. No wheezing or rales.  Abdominal:     General: Bowel sounds are normal. There is no distension.     Palpations: Abdomen is soft. There is no mass.     Tenderness: There is no abdominal tenderness. There is no guarding or rebound.  Musculoskeletal:     Cervical back: Neck supple.  Lymphadenopathy:     Cervical: No cervical adenopathy.  Skin:    General: Skin is warm and dry.     Findings: No rash.  Neurological:      Mental Status: She is alert and oriented to person, place, and time.     Cranial Nerves: No cranial nerve deficit.    Assessment/Plan: 1. Visit for preventive health examination Depression screen negative. Health Maintenance reviewed. Preventive schedule discussed and handout given in AVS. Will obtain fasting labs today. - CBC with Differential/Platelet - Comprehensive metabolic panel - Lipid panel - TSH  2. Obesity (BMI 30-39.9) Dietary and exercise recommendations reviewed.  Will check lipids TSH and vitamin D level today.  Will monitor - Lipid panel - TSH - Vitamin D (25 hydroxy)  3. Tinnitus aurium, right Examination unremarkable.  Will check B12 level today.  Will place referral to ENT for chronic tinnitus and decreased hearing. - Ambulatory referral to ENT - B12  4. Decreased hearing of right ear Referral to ENT with in-house audiology for further evaluation. - Ambulatory referral to ENT  5. Arthritis of first MTP joint Followed by podiatry/orthopedics.  We will have her start turmeric supplement OTC to see if this helps as she would like to avoid true medication if possible.  This visit occurred during the SARS-CoV-2 public health emergency.  Safety protocols were in place, including screening questions prior to the visit, additional usage of staff PPE, and extensive cleaning of exam room while observing appropriate contact time as indicated for disinfecting solutions.      Leeanne Rio, PA-C

## 2020-03-21 NOTE — Patient Instructions (Signed)
Please go to the lab for blood work.   Our office will call you with your results unless you have chosen to receive results via MyChart.  If your blood work is normal we will follow-up each year for physicals and as scheduled for chronic medical problems.  If anything is abnormal we will treat accordingly and get you in for a follow-up.  You will be contacted for assessment by Audiology/ENT. If you do not hear from the specialist office within a week, please let me know.   Start daily OTc Turmeric supplement, 500 mg max. I would recommend the NOW brand which you can get in store or online. I have been getting my supplements online from [email or web address][email or web address] so consider looking into this (they have periodic deals on different things).  Try to work up to a goal of 150 minutes of aerobic exercise per week.    Preventive Care 28-46 Years Old, Female Preventive care refers to visits with your health care provider and lifestyle choices that can promote health and wellness. This includes:  A yearly physical exam. This may also be called an annual well check.  Regular dental visits and eye exams.  Immunizations.  Screening for certain conditions.  Healthy lifestyle choices, such as eating a healthy diet, getting regular exercise, not using drugs or products that contain nicotine and tobacco, and limiting alcohol use. What can I expect for my preventive care visit? Physical exam Your health care provider will check your:  Height and weight. This may be used to calculate body mass index (BMI), which tells if you are at a healthy weight.  Heart rate and blood pressure.  Skin for abnormal spots. Counseling Your health care provider may ask you questions about your:  Alcohol, tobacco, and drug use.  Emotional well-being.  Home and relationship well-being.  Sexual activity.  Eating habits.  Work and work Statistician.  Method of birth control.  Menstrual cycle.  Pregnancy  history. What immunizations do I need?  Influenza (flu) vaccine  This is recommended every year. Tetanus, diphtheria, and pertussis (Tdap) vaccine  You may need a Td booster every 10 years. Varicella (chickenpox) vaccine  You may need this if you have not been vaccinated. Zoster (shingles) vaccine  You may need this after age 57. Measles, mumps, and rubella (MMR) vaccine  You may need at least one dose of MMR if you were born in 1957 or later. You may also need a second dose. Pneumococcal conjugate (PCV13) vaccine  You may need this if you have certain conditions and were not previously vaccinated. Pneumococcal polysaccharide (PPSV23) vaccine  You may need one or two doses if you smoke cigarettes or if you have certain conditions. Meningococcal conjugate (MenACWY) vaccine  You may need this if you have certain conditions. Hepatitis A vaccine  You may need this if you have certain conditions or if you travel or work in places where you may be exposed to hepatitis A. Hepatitis B vaccine  You may need this if you have certain conditions or if you travel or work in places where you may be exposed to hepatitis B. Haemophilus influenzae type b (Hib) vaccine  You may need this if you have certain conditions. Human papillomavirus (HPV) vaccine  If recommended by your health care provider, you may need three doses over 6 months. You may receive vaccines as individual doses or as more than one vaccine together in one shot (combination vaccines). Talk with your health care provider about the risks and  benefits of combination vaccines. What tests do I need? Blood tests  Lipid and cholesterol levels. These may be checked every 5 years, or more frequently if you are over 18 years old.  Hepatitis C test.  Hepatitis B test. Screening  Lung cancer screening. You may have this screening every year starting at age 73 if you have a 30-pack-year history of smoking and currently smoke or  have quit within the past 15 years.  Colorectal cancer screening. All adults should have this screening starting at age 68 and continuing until age 66. Your health care provider may recommend screening at age 62 if you are at increased risk. You will have tests every 1-10 years, depending on your results and the type of screening test.  Diabetes screening. This is done by checking your blood sugar (glucose) after you have not eaten for a while (fasting). You may have this done every 1-3 years.  Mammogram. This may be done every 1-2 years. Talk with your health care provider about when you should start having regular mammograms. This may depend on whether you have a family history of breast cancer.  BRCA-related cancer screening. This may be done if you have a family history of breast, ovarian, tubal, or peritoneal cancers.  Pelvic exam and Pap test. This may be done every 3 years starting at age 73. Starting at age 66, this may be done every 5 years if you have a Pap test in combination with an HPV test. Other tests  Sexually transmitted disease (STD) testing.  Bone density scan. This is done to screen for osteoporosis. You may have this scan if you are at high risk for osteoporosis. Follow these instructions at home: Eating and drinking  Eat a diet that includes fresh fruits and vegetables, whole grains, lean protein, and low-fat dairy.  Take vitamin and mineral supplements as recommended by your health care provider.  Do not drink alcohol if: ? Your health care provider tells you not to drink. ? You are pregnant, may be pregnant, or are planning to become pregnant.  If you drink alcohol: ? Limit how much you have to 0-1 drink a day. ? Be aware of how much alcohol is in your drink. In the [place] one drink equals one 12 oz bottle of beer (355 mL), one 5 oz glass of wine (148 mL), or one 1 oz glass of hard liquor (44 mL). Lifestyle  Take daily care of your teeth and gums.  Stay  active. Exercise for at least 30 minutes on 5 or more days each week.  Do not use any products that contain nicotine or tobacco, such as cigarettes, e-cigarettes, and chewing tobacco. If you need help quitting, ask your health care provider.  If you are sexually active, practice safe sex. Use a condom or other form of birth control (contraception) in order to prevent pregnancy and STIs (sexually transmitted infections).  If told by your health care provider, take low-dose aspirin daily starting at age 70. What's next?  Visit your health care provider once a year for a well check visit.  Ask your health care provider how often you should have your eyes and teeth checked.  Stay up to date on all vaccines. This information is not intended to replace advice given to you by your health care provider. Make sure you discuss any questions you have with your health care provider. Document Revised: 08/07/2018 Document Reviewed: 08/07/2018 Elsevier Patient Education  2020 [employer].

## 2020-03-22 ENCOUNTER — Other Ambulatory Visit (INDEPENDENT_AMBULATORY_CARE_PROVIDER_SITE_OTHER): Payer: 59

## 2020-03-22 DIAGNOSIS — R7309 Other abnormal glucose: Secondary | ICD-10-CM

## 2020-03-22 LAB — HEMOGLOBIN A1C: Hgb A1c MFr Bld: 5.5 % (ref 4.6–6.5)

## 2020-03-24 ENCOUNTER — Other Ambulatory Visit: Payer: Self-pay

## 2020-03-24 DIAGNOSIS — I10 Essential (primary) hypertension: Secondary | ICD-10-CM

## 2020-03-24 MED ORDER — ATORVASTATIN CALCIUM 10 MG PO TABS: 10.0000 mg | ORAL_TABLET | Freq: Every day | ORAL | 1 refills | Status: DC

## 2020-03-31 DIAGNOSIS — D485 Neoplasm of uncertain behavior of skin: Secondary | ICD-10-CM | POA: Diagnosis not present

## 2020-03-31 DIAGNOSIS — L820 Inflamed seborrheic keratosis: Secondary | ICD-10-CM | POA: Diagnosis not present

## 2020-03-31 DIAGNOSIS — C4441 Basal cell carcinoma of skin of scalp and neck: Secondary | ICD-10-CM | POA: Diagnosis not present

## 2020-03-31 DIAGNOSIS — L821 Other seborrheic keratosis: Secondary | ICD-10-CM | POA: Diagnosis not present

## 2020-04-20 DIAGNOSIS — H9311 Tinnitus, right ear: Secondary | ICD-10-CM | POA: Diagnosis not present

## 2020-04-20 DIAGNOSIS — H9041 Sensorineural hearing loss, unilateral, right ear, with unrestricted hearing on the contralateral side: Secondary | ICD-10-CM | POA: Diagnosis not present

## 2020-04-20 DIAGNOSIS — R0982 Postnasal drip: Secondary | ICD-10-CM | POA: Diagnosis not present

## 2020-07-01 DIAGNOSIS — H9041 Sensorineural hearing loss, unilateral, right ear, with unrestricted hearing on the contralateral side: Secondary | ICD-10-CM | POA: Diagnosis not present

## 2020-09-28 ENCOUNTER — Ambulatory Visit (INDEPENDENT_AMBULATORY_CARE_PROVIDER_SITE_OTHER): Payer: 59

## 2020-09-28 DIAGNOSIS — Z23 Encounter for immunization: Secondary | ICD-10-CM

## 2020-10-12 MED FILL — FLUTICASONE PROP 50 MCG SPR: 50 | 30 days supply | Qty: 16 | Fill #0

## 2020-12-10 DIAGNOSIS — C4491 Basal cell carcinoma of skin, unspecified: Secondary | ICD-10-CM

## 2020-12-10 HISTORY — DX: Basal cell carcinoma of skin, unspecified: C44.91

## 2020-12-13 ENCOUNTER — Ambulatory Visit: Payer: Self-pay

## 2020-12-13 ENCOUNTER — Encounter: Payer: Self-pay | Admitting: Internal Medicine

## 2020-12-13 ENCOUNTER — Other Ambulatory Visit: Payer: Self-pay

## 2020-12-13 ENCOUNTER — Ambulatory Visit: Payer: 59 | Admitting: Internal Medicine

## 2020-12-13 VITALS — BP 119/68 | HR 80 | Ht 64.5 in | Wt 205.0 lb

## 2020-12-13 DIAGNOSIS — M79642 Pain in left hand: Secondary | ICD-10-CM

## 2020-12-13 DIAGNOSIS — M79641 Pain in right hand: Secondary | ICD-10-CM

## 2020-12-13 DIAGNOSIS — M19079 Primary osteoarthritis, unspecified ankle and foot: Secondary | ICD-10-CM

## 2020-12-13 NOTE — Progress Notes (Signed)
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 female here for evaluation of bilateral hand pain and increasing finger nodules. She has had pain in the right great toe since a few years ago with workup demonstrating osteoarthritis with dorsal bone spurring so far treated conservatively. Now she is noticing new firm, red nodules over several finger joints with a moderate amount of pain and stiffness. Pain is often worse when using her hands and tender to pressure or impacts on these areas. She has a family history of rapidly progressive, deforming, rheumatoid arthritis and has concerns whether the current symptoms may indicate disease activity. She has a possible history of fibromyalgia but does not really describe generalized pain or sensitivity during this time period.  She has undergone bilateral salpingo-oophorectomy due to excessive bleeding in 2018 and has had decreasing vasomotor symptoms since that time. Otherwise no major recent health changes.  Activities of Daily Living:  Patient reports morning stiffness for 5-10 minutes.   Patient Reports nocturnal pain.  Difficulty dressing/grooming: Denies Difficulty climbing stairs: Denies Difficulty getting out of chair: Denies Difficulty using hands for taps, buttons, cutlery, and/or writing: Denies  Review of Systems  Constitutional: Positive for fatigue.  HENT: Negative for mouth sores, mouth dryness and nose dryness.   Eyes: Negative for pain, itching, visual disturbance and dryness.  Respiratory: Negative for cough, hemoptysis, shortness of breath and  difficulty breathing.   Cardiovascular: Positive for palpitations. Negative for chest pain and swelling in legs/feet.  Gastrointestinal: Positive for constipation. Negative for abdominal pain, blood in stool and diarrhea.  Endocrine: Negative for increased urination.  Genitourinary: Negative for painful urination.  Musculoskeletal: Positive for arthralgias, joint pain, joint swelling, myalgias, muscle weakness, morning stiffness, muscle tenderness and myalgias.  Skin: Positive for nodules/bumps. Negative for color change, rash and redness.  Allergic/Immunologic: Negative for susceptible to infections.  Neurological: Positive for headaches and memory loss. Negative for dizziness, numbness and weakness.  Hematological: Negative for swollen glands.  Psychiatric/Behavioral: Negative for confusion and sleep disturbance.    PMFS History:  Patient Active Problem List   Diagnosis Date Noted  . Bilateral hand pain 12/13/2020  . 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 Fibromyalgia 05/07/2018  . Chronic left sacroiliac joint pain 05/07/2018  . Obesity (BMI 30-39.9) 05/07/2018  . Arthritis of first MTP joint 04/28/2012    Past Medical History:  Diagnosis Date  . Arthritis of foot    R first MTP (xray 04/2012)  . Fibromyalgia   . GERD (gastroesophageal reflux disease)    diet controlled - no meds  . Herpes labialis    fever blisters when in the sun  . High cholesterol    per pt--improved with diet/exercise  . Hypotension   . Iron deficiency anemia   . Left shoulder pain   . Low back pain    sees Dr. Lynann Bologna  . Menorrhagia    Dr. Toney Rakes  . Ovarian cyst    (surgery age 81)    Family  History  Problem Relation Age of Onset  . Rheum arthritis Mother   . Arthritis Mother   . COPD Mother   . Asthma Mother        child  . Alcohol abuse Father   . Heart disease Father 54  . Hypertension Father   . Irritable bowel syndrome Sister   . Bipolar disorder Sister   . Alcohol abuse Sister   .  Colon polyps Sister        teenager  . Asthma Brother   . Mental illness Maternal Aunt        fixed delusions  . Hyperlipidemia Maternal Uncle   . Hyperlipidemia Maternal Grandmother   . Heart disease Maternal Grandmother        MI  . Stroke Maternal Grandmother   . Heart disease Paternal Grandmother 40       MI  . Cancer Paternal Grandfather        lung cancer  . GER disease Son   . Diabetes Neg Hx   . Breast cancer Neg Hx   . Colon cancer Neg Hx   . Esophageal cancer Neg Hx   . Rectal cancer Neg Hx   . Stomach cancer Neg Hx    Past Surgical History:  Procedure Laterality Date  . APPENDECTOMY     55 YR OLD  . BREAST BIOPSY Left 2019   fibroadenoma  . CYSTOSCOPY N/A 06/11/2017   Procedure: CYSTOSCOPY;  Surgeon: Anastasio Auerbach, MD;  Location: [facility];  Service: Gynecology;  Laterality: N/A;  . DILATATION & CURETTAGE/HYSTEROSCOPY WITH TRUECLEAR N/A 03/19/2014   Procedure: DILATATION & CURETTAGE/HYSTEROSCOPY WITH TRUCLEAR;  Surgeon: Terrance Mass, MD;  Location: [facility];  Service: Gynecology;  Laterality: N/A;  Resectoscopic Polypectomy  . LAPAROSCOPIC VAGINAL HYSTERECTOMY WITH SALPINGO OOPHORECTOMY Bilateral 06/11/2017   Procedure: LAPAROSCOPIC ASSISTED VAGINAL HYSTERECTOMY WITH SALPINGO OOPHORECTOMY;  Surgeon: Anastasio Auerbach, MD;  Location: [facility];  Service: Gynecology;  Laterality: Bilateral;  . OVARIAN CYST REMOVAL     laparotomy  . pyloric stenosis repair  infant  . WRIST GANGLION EXCISION     Social History   Social History Narrative   Married.   Daughter is 3 rd physiatry resident in [place]   Son lives in [place], [place] grandchild    Youngest son is at [employer] History  Administered Date(s) Administered  . Influenza Split 08/30/2011, 08/24/2014  . Influenza-Unspecified 08/10/2016  . PFIZER SARS-COV-2 Vaccination 12/02/2019, 12/21/2019, 09/28/2020     Objective: Vital Signs: BP 119/68 (BP Location: Left Arm, Patient Position: Sitting, Cuff  Size: Normal)   Pulse 80   Ht 5' 4.5" (1.638 m)   Wt 205 lb (93 kg)   LMP 03/26/2017   BMI 34.64 kg/m    Physical Exam HENT:     Right Ear: External ear normal.     Left Ear: External ear normal.     Mouth/Throat:     Mouth: Mucous membranes are moist.     Pharynx: Oropharynx is clear.  Eyes:     Conjunctiva/sclera: Conjunctivae normal.  Cardiovascular:     Rate and Rhythm: Normal rate and regular rhythm.  Pulmonary:     Effort: Pulmonary effort is normal.     Breath sounds: Normal breath sounds.  Skin:    General: Skin is warm and dry.     Findings: No rash.     Comments: Scaly patch over left elbow without erythema, diffusely dry skin, few longitudinal nail ridges without pitting  or onycholysis  Neurological:     General: No focal deficit present.     Mental Status: She is alert.  Psychiatric:        Mood and Affect: Mood normal.     Musculoskeletal Exam:  Neck full range of motion Shoulder, elbow, wrist full range of motion no tenderness or swelling Hands no synovitis seen, tenderness to pressure over 1st CMC joint bilaterally, small cyst on dorsum of 3rd PIP, heberdon's nodes especially at right 2nd DIP, full ROM and normal strength Normal hip internal and external rotation without pain, no tenderness to lateral hip palpation Knees, ankles full range of motion no tenderness or swelling Right 1st MTP severely reduced ROM digits otherwise normal appearance and ROM   Investigation: No additional findings.  Imaging: XR Hand 2 View Left  Result Date: 12/13/2020 Xray left hand 2 views Radiocarpal joint space appears normal. Normal carpal bones. MCP and PIP joint spaces look normal, early osteophytes at 2nd-3rd DIP joints. Small periosteal reactions in proximal and middle phalanges. Normal bone mineralization. No soft tissue swelling seen. Impression Mild distal osteoarthritis, no erosions or demineralization seen  XR Hand 2 View Right  Result Date: 12/13/2020 Xray  right hand 2 views Radiocarpal joint space normal. Carpal bones normal mild arthritis of the 1st CMC joint. MCPs appear normal. Proximal phalanges with mild periosteal reactions present. PIP joint space narrowing most at ulnar side of 3rd PIP. 2nd and 3rd DIP joint osteophytes present. Bone mineralization appears normal. No soft tissue swelling seen. Impression Osteoarthritis changes in 1st [facility] joint and 2-3rd digits PIP and DIPs   Recent Labs: Lab Results  Component Value Date   WBC 6.1 03/21/2020   HGB 13.3 03/21/2020   PLT 293.0 03/21/2020   NA 140 03/21/2020   K 3.8 03/21/2020   CL 105 03/21/2020   CO2 27 03/21/2020   GLUCOSE 102 (H) 03/21/2020   BUN 12 03/21/2020   CREATININE 0.71 03/21/2020   BILITOT 0.4 03/21/2020   ALKPHOS 47 03/21/2020   AST 24 03/21/2020   ALT 36 (H) 03/21/2020   PROT 6.5 03/21/2020   ALBUMIN 4.1 03/21/2020   CALCIUM 9.3 03/21/2020   GFRAA >60 06/05/2017    Speciality Comments: No specialty comments available.  Procedures:  No procedures performed Allergies: Codeine, Latex, and Tetracyclines & related   Assessment / Plan:     Visit Diagnoses: Bilateral hand pain - Plan: XR Hand 2 View Right, XR Hand 2 View Left   Bilateral hand nodules appear to be osteoarthritis related osteophyte formation. I do not see any synovitis on exam or ultrasound and there is no bone demineralization or erosions on xray to suggest rheumatoid arthritis. Her skin changes look more like eczema than psoriasis but cannot entirely exclude this as a possibility. However no inflammatory changes indicate PsA currently either so would not suspect unless symptoms changed significantly. We discussed OA management including oral or topical NSAIDs, exercise, splinting/sleeves, heat and cold therapy, local steroid injection, and occupational therapy evaluation as options if symptoms worsen over time or impair work function. No additional rheumatology testing needed at this time.  Arthritis  of first MTP joint  Right toe arthritis the range of motion is reduced to an extent problematic for normal gait. She may benefit from surgical treatment of this if worsening no urgent indication at this time.  Orders: Orders Placed This Encounter  Procedures  . XR Hand 2 View Right  . XR Hand 2 View Left   No orders  of the defined types were placed in this encounter.   Follow-Up Instructions: Return if symptoms worsen or fail to improve.   Collier Salina, MD  Note - This record has been created using [employer].  Chart creation errors have been sought, but may not always  have been located. Such creation errors do not reflect on  the standard of medical care.

## 2020-12-24 DIAGNOSIS — Z0100 Encounter for examination of eyes and vision without abnormal findings: Secondary | ICD-10-CM | POA: Diagnosis not present

## 2021-03-16 ENCOUNTER — Telehealth: Payer: 59 | Admitting: Physician Assistant

## 2021-03-16 DIAGNOSIS — R0981 Nasal congestion: Secondary | ICD-10-CM

## 2021-03-16 MED ORDER — AMOXICILLIN-POT CLAVULANATE 875-125 MG PO TABS: 1.0000 | ORAL_TABLET | Freq: Two times a day (BID) | ORAL | 0 refills | Status: AC

## 2021-03-16 MED ORDER — FLUTICASONE PROPIONATE 50 MCG/ACT NA SUSP: 2.0000 | Freq: Every day | NASAL | 0 refills | Status: DC

## 2021-03-16 NOTE — Progress Notes (Signed)
We are sorry that you are not feeling well.  Here is how we plan to help!  Based on what you have shared with me it looks like you have sinusitis.  Sinusitis is inflammation and infection in the sinus cavities of the head.  Based on your presentation I believe you most likely have Acute Viral Sinusitis.This is an infection most likely caused by a virus. There is not specific treatment for viral sinusitis other than to help you with the symptoms until the infection runs its course.  You may use an oral decongestant such as Mucinex D or if you have glaucoma or high blood pressure use plain Mucinex. Saline nasal spray help and can safely be used as often as needed for congestion, I have prescribed: Fluticasone nasal spray two sprays in each nostril once a day   Sometimes viral sinus infections can progress into bacterial sinus infections. Usually this only occurs after symptoms have been present for 1 week or if you develop a fever. If your symptoms persist past 7 days or if you develop a fever, I have prescribed an antibiotic, Augmentin, to take twice daily for your symptoms. Please monitor symptoms and do not fill the prescription unless your symptoms persist or progress.   Some authorities believe that zinc sprays or the use of Echinacea may shorten the course of your symptoms.  Sinus infections are not as easily transmitted as other respiratory infection, however we still recommend that you avoid close contact with loved ones, especially the very young and elderly.  Remember to wash your hands thoroughly throughout the day as this is the number one way to prevent the spread of infection!  Home Care: Only take medications as instructed by your medical team. Do not take these medications with alcohol. A steam or ultrasonic humidifier can help congestion.  You can place a towel over your head and breathe in the steam from hot water coming from a faucet. Avoid close contacts especially the very young and  the elderly. Cover your mouth when you cough or sneeze. Always remember to wash your hands.  Get Help Right Away If: You develop worsening fever or sinus pain. You develop a severe head ache or visual changes. Your symptoms persist after you have completed your treatment plan.  Make sure you Understand these instructions. Will watch your condition. Will get help right away if you are not doing well or get worse.  Your e-visit answers were reviewed by a board certified advanced clinical practitioner to complete your personal care plan.  Depending on the condition, your plan could have included both over the counter or prescription medications.  If there is a problem please reply  once you have received a response from your provider.  Your safety is important to us.  If you have drug allergies check your prescription carefully.    You can use MyChart to ask questions about today's visit, request a non-urgent call back, or ask for a work or school excuse for 24 hours related to this e-Visit. If it has been greater than 24 hours you will need to follow up with your provider, or enter a new e-Visit to address those concerns.  You will get an e-mail in the next two days asking about your experience.  I hope that your e-visit has been valuable and will speed your recovery. Thank you for using e-visits.  Approximately 5 minutes was spent documenting and reviewing patient's chart.  

## 2021-06-23 ENCOUNTER — Ambulatory Visit: Payer: 59 | Attending: Internal Medicine

## 2021-06-23 DIAGNOSIS — Z23 Encounter for immunization: Secondary | ICD-10-CM

## 2021-06-23 NOTE — Progress Notes (Signed)
   Covid-19 Vaccination Clinic  Name:  ALLEAH DEARMAN    MRN: [id number] DOB: [birth year]  06/23/2021  Ms. Letson was observed post Covid-19 immunization for 15 minutes without incident. She was provided with Vaccine Information Sheet and instruction to access the V-Safe system.   Ms. Latif was instructed to call 911 with any severe reactions post vaccine: Difficulty breathing  Swelling of face and throat  A fast heartbeat  A bad rash all over body  Dizziness and weakness   Immunizations Administered     Name Date Dose VIS Date Route   PFIZER Comrnaty(Gray TOP) Covid-19 Vaccine 06/23/2021  1:21 PM 0.3 mL 11/17/2020 Intramuscular   Manufacturer: [employer]   Lot: [id number]   [place]: [phone]

## 2021-06-26 ENCOUNTER — Ambulatory Visit: Payer: 59

## 2021-07-31 ENCOUNTER — Encounter (HOSPITAL_BASED_OUTPATIENT_CLINIC_OR_DEPARTMENT_OTHER): Payer: Self-pay | Admitting: Family Medicine

## 2021-07-31 ENCOUNTER — Ambulatory Visit (HOSPITAL_BASED_OUTPATIENT_CLINIC_OR_DEPARTMENT_OTHER): Payer: 59 | Admitting: Family Medicine

## 2021-07-31 ENCOUNTER — Other Ambulatory Visit: Payer: Self-pay

## 2021-07-31 VITALS — BP 126/78 | HR 68 | Ht 64.5 in | Wt 207.2 lb

## 2021-07-31 DIAGNOSIS — Z600 Problems of adjustment to life-cycle transitions: Secondary | ICD-10-CM | POA: Diagnosis not present

## 2021-07-31 DIAGNOSIS — E669 Obesity, unspecified: Secondary | ICD-10-CM | POA: Diagnosis not present

## 2021-07-31 DIAGNOSIS — R232 Flushing: Secondary | ICD-10-CM | POA: Insufficient documentation

## 2021-07-31 DIAGNOSIS — R5383 Other fatigue: Secondary | ICD-10-CM | POA: Insufficient documentation

## 2021-07-31 DIAGNOSIS — G479 Sleep disorder, unspecified: Secondary | ICD-10-CM | POA: Diagnosis not present

## 2021-07-31 NOTE — Assessment & Plan Note (Signed)
Possibly related to hormonal changes, sleep disturbances Check labs as below Refer to GYN related to possible hormonal changes

## 2021-07-31 NOTE — Assessment & Plan Note (Signed)
Patient with some increased stress at work, issues with sleep Due to ongoing symptoms, impact in daily activities, will refer to Dr. Michail Sermon for further evaluation

## 2021-07-31 NOTE — Assessment & Plan Note (Signed)
Possibly related to hormonal changes Will refer to GYN -patient will look into providers and let us know who she would like to be referred to Also check labs as below

## 2021-07-31 NOTE — Progress Notes (Signed)
New Patient Office Visit  Subjective:  Patient ID: Sharon Simpson, female    DOB: 1966-10-10  Age: 55 y.o. MRN: [id number]  CC:  Chief Complaint  Patient presents with   Establish Care    Prior PCP Raiford Noble   Post-op Problem    Patient has a hysterectomy in 2018 and states that since she has been experiencing fatigue, palipitations, hot flashes, and some trouble remembering things. She does admit to having a stressful job, but wonders how much of it is her not having been on hormone replacement therapy after her procedure. She also mentioned that in her 20's she was being treated for ADHD and wonders if that has anything to do with memory issues currently    HPI Sharon Simpson is a 55 year old female presenting to establish in clinic.  She reports current issues related to fatigue, hot flashes, trouble sleeping, memory concerns.  Past medical history significant for hyperlipidemia, GERD, herpes labialis.  Past surgical history includes total hysterectomy.  Patient's primary concerns today are related to ongoing symptoms of fatigue, intermittent hot flashes, memory concerns.  Feels that she has noticed symptoms over the past few years, but have become more noticeable over the past 9 months.  Does feel the symptoms started shortly after hysterectomy that she had in 2018.  She also relates increased stress at work and at home.  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  Does report that when she was younger she was diagnosed with ADHD and was on meds, this was greater than 20 years ago.  She is unsure if this may be contributing to symptoms.  Past Medical History:  Diagnosis Date   Arthritis of foot    R first MTP (xray 04/2012)   Fibromyalgia    GERD (gastroesophageal reflux disease)    diet controlled - no meds   Herpes labialis    fever blisters when in the sun   High cholesterol    per pt--improved with diet/exercise   Hypotension    Iron deficiency anemia     Left shoulder pain    Low back pain    sees Dr. Lynann Bologna   Menorrhagia    Dr. Toney Rakes   Ovarian cyst    (surgery age 89)    Past Surgical History:  Procedure Laterality Date   APPENDECTOMY     55 YR OLD   BREAST BIOPSY Left 2019   fibroadenoma   CYSTOSCOPY N/A 06/11/2017   Procedure: CYSTOSCOPY;  Surgeon: Anastasio Auerbach, MD;  Location: [facility];  Service: Gynecology;  Laterality: N/A;   DILATATION & CURETTAGE/HYSTEROSCOPY WITH TRUECLEAR N/A 03/19/2014   Procedure: DILATATION & CURETTAGE/HYSTEROSCOPY WITH TRUCLEAR;  Surgeon: Terrance Mass, MD;  Location: [facility];  Service: Gynecology;  Laterality: N/A;  Resectoscopic Polypectomy   LAPAROSCOPIC VAGINAL HYSTERECTOMY WITH SALPINGO OOPHORECTOMY Bilateral 06/11/2017   Procedure: LAPAROSCOPIC ASSISTED VAGINAL HYSTERECTOMY WITH SALPINGO OOPHORECTOMY;  Surgeon: Anastasio Auerbach, MD;  Location: [facility];  Service: Gynecology;  Laterality: Bilateral;   OVARIAN CYST REMOVAL     laparotomy   pyloric stenosis repair  infant   WRIST GANGLION EXCISION      Family History  Problem Relation Age of Onset   Rheum arthritis Mother    Arthritis Mother    COPD Mother    Asthma Mother        child   Alcohol abuse Father    Heart disease Father 64   Hypertension Father    Irritable  bowel syndrome Sister    Bipolar disorder Sister    Alcohol abuse Sister    Colon polyps Sister        teenager   Asthma Brother    Mental illness Maternal Aunt        fixed delusions   Hyperlipidemia Maternal Uncle    Hyperlipidemia Maternal Grandmother    Heart disease Maternal Grandmother        MI   Stroke Maternal Grandmother    Heart disease Paternal Grandmother 85       MI   Cancer Paternal Grandfather        lung cancer   GER disease Son    Diabetes Neg Hx    Breast cancer Neg Hx    Colon cancer Neg Hx    Esophageal cancer Neg Hx    Rectal cancer Neg Hx    Stomach cancer Neg Hx     Social History   Socioeconomic History   Marital  status: Married    Spouse name: Not on file   Number of children: 3   Years of education: Not on file   Highest education level: Not on file  Occupational History   Occupation: Surveyor, quantity for pediatric care at [employer]; Programmer, multimedia: [employer]  Tobacco Use   Smoking status: Former    Packs/day: 1.00    Years: 8.00    Pack years: 8.00    Types: Cigarettes   Smokeless tobacco: Never   Tobacco comments:    quit age 20 (from age [age], 1 PPD)  Vaping Use   Vaping Use: Never used  Substance and Sexual Activity   Alcohol use: No    Alcohol/week: 0.0 standard drinks   Drug use: No   Sexual activity: Yes    Partners: Male    Birth control/protection: Surgical    Comment: VASECTOMY  Other Topics Concern   Not on file  Social History Narrative   Married.   Daughter is 3 rd physiatry resident in [place]   Son lives in [place], [place] grandchild    [place] son is at [employer] of [employer] broadcast assistant Strain: Not on [employer] Insecurity: Not on file  Transportation Needs: Not on file  Physical Activity: Not on file  Stress: Not on file  Social Connections: Not on file  Intimate Partner Violence: Not on file    Objective:   Today's Vitals: BP 126/78   Pulse 68   Ht 5' 4.5" (1.638 m)   Wt 207 lb 3.2 oz (94 kg)   LMP 03/26/2017   SpO2 98%   BMI 35.02 kg/m   Physical Exam  55 year old female in no acute distress Cardiovascular exam with regular rate and rhythm, no murmurs appreciated Lungs clear to auscultation bilaterally  Assessment & Plan:   Problem List Items Addressed This Visit       Cardiovascular and Mediastinum   Hot flashes    Possibly related to hormonal changes Will refer to GYN -patient will look into providers and let us know who she would like to be referred to Also check labs as below      Relevant Orders   Comprehensive metabolic panel   TSH Rfx on Abnormal to Free T4     Other   Obesity (BMI 30-39.9) -  Primary    Encourage lifestyle modifications including dietary changes as well as ensuring adequate weekly physical activity Patient is interested in beginning regimen of  yoga Continue to monitor weight at future visits Check labs as below      Relevant Orders   Comprehensive metabolic panel   Hemoglobin A1c   Lipid panel   Fatigue    Possibly related to hormonal changes, sleep disturbances Check labs as below Refer to GYN related to possible hormonal changes      Relevant Orders   CBC with Differential/Platelet   Hemoglobin A1c   Lipid panel   TSH Rfx on Abnormal to Free T4   Phase of life problem    Patient with some increased stress at work, issues with sleep Due to ongoing symptoms, impact in daily activities, will refer to Dr. Michail Sermon for further evaluation      Relevant Orders   Ambulatory referral to Psychology   Sleep disturbance    Possibly related to underlying hormonal changes, possible component related to increased stress at work Will refer to Dr. Michail Sermon for further evaluation and management      Relevant Orders   TSH Rfx on Abnormal to Free T4   Ambulatory referral to Psychology    Outpatient Encounter Medications as of 07/31/2021  Medication Sig   fluticasone (FLONASE) 50 MCG/ACT nasal spray Place 2 sprays into both nostrils daily.   Multiple Vitamins-Minerals (MULTIVITAMIN WITH MINERALS) tablet Take 1 tablet by mouth daily.   valACYclovir (VALTREX) 1000 MG tablet Take 1,000 mg by mouth daily as needed (fever blister). Reported on 04/18/2016   [DISCONTINUED] atorvastatin (LIPITOR) 10 MG tablet Take 1 tablet (10 mg total) by mouth daily.   [DISCONTINUED] Biotin 1000 MCG CHEW Chew 1,500 mcg by mouth.   [DISCONTINUED] co-enzyme Q-10 30 MG capsule Take 30 mg by mouth 3 (three) times daily.   [DISCONTINUED] fluticasone (FLONASE) 50 MCG/ACT nasal spray Place 2 sprays into both nostrils daily.   [DISCONTINUED] Multiple Vitamins-Minerals (MULTI-VITAMIN  MENOPAUSAL) TABS Take by mouth. Abordine supplement for menopausal symptoms (Patient not taking: Reported on 12/13/2020)   [DISCONTINUED] VITAMIN D-VITAMIN K PO Take 1 tablet by mouth daily. (Patient not taking: Reported on 12/13/2020)   No facility-administered encounter medications on file as of 07/31/2021.    Follow-up: Return in about 8 weeks (around 09/25/2021).  Plan to complete CPE at follow-up visit, review labs, follow-up on referrals  Teressa Mcglocklin J [place], MD

## 2021-07-31 NOTE — Assessment & Plan Note (Signed)
Possibly related to underlying hormonal changes, possible component related to increased stress at work Will refer to Dr. Michail Sermon for further evaluation and management

## 2021-07-31 NOTE — Patient Instructions (Signed)
  Medication Instructions:  Your physician recommends that you continue on your current medications as directed. Please refer to the Current Medication list given to you today. --If you need a refill on any your medications before your next appointment, please call your pharmacy first. If no refills are authorized on file call the office.--  Lab Work: Your physician has recommended that you have lab work today: CBC, CMP, Lipid Profile, TSH with Reflex, and A1C If you have labs (blood work) drawn today and your tests are completely normal, you will receive your results via [facility] a phone call from our staff.  Please ensure you check your voicemail in the event that you authorized detailed messages to be left on a delegated number. If you have any lab test that is abnormal or we need to change your treatment, we will call you to review the results.  Referrals/Procedures/Imaging: A referral has been placed for you to Dr. Michail Sermon with [facility]. Dr. Michail Sermon is a psychologist who specializes in cognitive and behavioral therapy, he does not prescribe medications. Someone from the scheduling department will be in contact with you in regards to coordinating your consultation. If you do not hear from any of the schedulers within 7-10 business days please give our office a call  Follow-Up: Your next appointment:   Your physician recommends that you schedule a follow-up appointment in: 6-8 WEEKS with Dr. de [place]  Thanks for letting us be apart of your health journey!!  Primary Care and Sports Medicine   Dr. Arlina Robes [place]   We encourage you to activate your patient portal called "MyChart".  Sign up information is provided on this After Visit Summary.  MyChart is used to connect with patients for Virtual Visits (Telemedicine).  Patients are able to view lab/test results, encounter notes, upcoming appointments, etc.  Non-urgent messages can be sent to your provider as well.  To learn more about what you can do with MyChart, please visit --  [email or web address].

## 2021-07-31 NOTE — Assessment & Plan Note (Signed)
Encourage lifestyle modifications including dietary changes as well as ensuring adequate weekly physical activity Patient is interested in beginning regimen of yoga Continue to monitor weight at future visits Check labs as below

## 2021-08-01 LAB — CBC WITH DIFFERENTIAL/PLATELET
Basophils Absolute: 0.1 10*3/uL (ref 0.0–0.2)
Basos: 1 %
EOS (ABSOLUTE): 0.2 10*3/uL (ref 0.0–0.4)
Eos: 2 %
Hematocrit: 40.3 % (ref 34.0–46.6)
Hemoglobin: 13.9 g/dL (ref 11.1–15.9)
Immature Grans (Abs): 0 10*3/uL (ref 0.0–0.1)
Immature Granulocytes: 0 %
Lymphocytes Absolute: 2.1 10*3/uL (ref 0.7–3.1)
Lymphs: 34 %
MCH: 29.3 pg (ref 26.6–33.0)
MCHC: 34.5 g/dL (ref 31.5–35.7)
MCV: 85 fL (ref 79–97)
Monocytes Absolute: 0.4 10*3/uL (ref 0.1–0.9)
Monocytes: 7 %
Neutrophils Absolute: 3.5 10*3/uL (ref 1.4–7.0)
Neutrophils: 56 %
Platelets: 308 10*3/uL (ref 150–450)
RBC: 4.75 x10E6/uL (ref 3.77–5.28)
RDW: 12.3 % (ref 11.7–15.4)
WBC: 6.3 10*3/uL (ref 3.4–10.8)

## 2021-08-01 LAB — COMPREHENSIVE METABOLIC PANEL
ALT: 42 IU/L — ABNORMAL HIGH (ref 0–32)
AST: 32 IU/L (ref 0–40)
Albumin/Globulin Ratio: 1.7 (ref 1.2–2.2)
Albumin: 4.5 g/dL (ref 3.8–4.9)
Alkaline Phosphatase: 57 IU/L (ref 44–121)
BUN/Creatinine Ratio: 13 (ref 9–23)
BUN: 11 mg/dL (ref 6–24)
Bilirubin Total: 0.4 mg/dL (ref 0.0–1.2)
CO2: 22 mmol/L (ref 20–29)
Calcium: 9.7 mg/dL (ref 8.7–10.2)
Chloride: 102 mmol/L (ref 96–106)
Creatinine, Ser: 0.84 mg/dL (ref 0.57–1.00)
Globulin, Total: 2.6 g/dL (ref 1.5–4.5)
Glucose: 90 mg/dL (ref 65–99)
Potassium: 4 mmol/L (ref 3.5–5.2)
Sodium: 140 mmol/L (ref 134–144)
Total Protein: 7.1 g/dL (ref 6.0–8.5)
eGFR: 82 mL/min/{1.73_m2} (ref >59)

## 2021-08-01 LAB — LIPID PANEL
Chol/HDL Ratio: 5.1 ratio — ABNORMAL HIGH (ref 0.0–4.4)
Cholesterol, Total: 246 mg/dL — ABNORMAL HIGH (ref 100–199)
HDL: 48 mg/dL (ref >39)
LDL Chol Calc (NIH): 168 mg/dL — ABNORMAL HIGH (ref 0–99)
Triglycerides: 165 mg/dL — ABNORMAL HIGH (ref 0–149)
VLDL Cholesterol Cal: 30 mg/dL (ref 5–40)

## 2021-08-01 LAB — TSH RFX ON ABNORMAL TO FREE T4: TSH: 2.21 u[IU]/mL (ref 0.450–4.500)

## 2021-08-01 LAB — HEMOGLOBIN A1C
Est. average glucose Bld gHb Est-mCnc: 114 mg/dL
Hgb A1c MFr Bld: 5.6 % (ref 4.8–5.6)

## 2021-08-02 ENCOUNTER — Telehealth (HOSPITAL_BASED_OUTPATIENT_CLINIC_OR_DEPARTMENT_OTHER): Payer: Self-pay

## 2021-08-02 NOTE — Telephone Encounter (Signed)
-----   Message from Raymond J de [place], MD sent at 08/01/2021  8:41 AM EDT ----- Dema Severin blood cell and red blood cell counts are normal with normal hemoglobin.  Electrolytes and kidney function are normal.  Very slight elevation in one of the liver enzymes, stable from prior reading about 1 year ago.  Hemoglobin A1c within normal range at 5.6%.  Thyroid function is normal.  Lipid panel with elevated total cholesterol and elevated "bad" cholesterol.  Would initially focus on lifestyle modifications to address cholesterol issues.  This will include dietary changes - incorporating fresh fruits and vegetables, lean protein in the diet and reducing consumption of red meats, saturated fats, processed foods.  Recommend gradually increasing level of activity as discussed in the office.  Eventual goal should be about 150 minutes/week of moderate intensity aerobic exercise.  Can discuss further at next office visit.

## 2021-08-02 NOTE — Telephone Encounter (Signed)
Results released by Dr. de [place] and reviewed by patient via MyChart Instructed patient to contact the office with any questions or concerns.  

## 2021-08-16 ENCOUNTER — Other Ambulatory Visit: Payer: Self-pay

## 2021-08-16 ENCOUNTER — Encounter (HOSPITAL_BASED_OUTPATIENT_CLINIC_OR_DEPARTMENT_OTHER): Payer: Self-pay | Admitting: Obstetrics & Gynecology

## 2021-08-16 ENCOUNTER — Ambulatory Visit (INDEPENDENT_AMBULATORY_CARE_PROVIDER_SITE_OTHER): Payer: 59 | Admitting: Obstetrics & Gynecology

## 2021-08-16 VITALS — BP 111/58 | HR 74 | Ht 64.5 in | Wt 206.2 lb

## 2021-08-16 DIAGNOSIS — Z01419 Encounter for gynecological examination (general) (routine) without abnormal findings: Secondary | ICD-10-CM | POA: Diagnosis not present

## 2021-08-16 DIAGNOSIS — Z23 Encounter for immunization: Secondary | ICD-10-CM

## 2021-08-16 DIAGNOSIS — Z8249 Family history of ischemic heart disease and other diseases of the circulatory system: Secondary | ICD-10-CM

## 2021-08-16 DIAGNOSIS — G250 Essential tremor: Secondary | ICD-10-CM

## 2021-08-16 DIAGNOSIS — R002 Palpitations: Secondary | ICD-10-CM

## 2021-08-16 DIAGNOSIS — E8940 Asymptomatic postprocedural ovarian failure: Secondary | ICD-10-CM

## 2021-08-16 DIAGNOSIS — E785 Hyperlipidemia, unspecified: Secondary | ICD-10-CM

## 2021-08-16 DIAGNOSIS — Z1231 Encounter for screening mammogram for malignant neoplasm of breast: Secondary | ICD-10-CM

## 2021-08-16 NOTE — Patient Instructions (Signed)
[place]: [phone]

## 2021-08-16 NOTE — Progress Notes (Addendum)
55 y.o. G103P3003 Married White or Caucasian female here for annual exam/new patient visit.  H/o LAVH with BSO in 2018 due to anemia and fibroid uterus.  Did have hot flashes after her surgery.  That has improved a lot.  During the last year, she has noticed that her heart can feel like it is racing.  She can't attribute the heart racing to anything in particular.  Did notice some improvement when on vacation so may be stress related.  Is in a [employer] role over practices at [employer].  Does have hx of elevated lipids.  Last done 08/01/2021 and total cholesterol was 246 and LDLs were 168.  Not on a statin.  ACC/AHA risk calculation is 2.8% 10 year risk.  Also reports some issues with attention to detail and memory that started with her surgery but seem to be more present now.  Isn't forgetful just having more trouble with rapid recall.  Has to write a lot of things down.  Also has some issues with getting tasks accomplished and feels like evaluation for ADD would be helpful.  Denies vaginal bleeding.  Reports hx of hand tremor.  Many people in family have it.  Never had formal evaluation.  Grandchildren notice it now and comment on it.  Wonders if anything should be done.  Patient's last menstrual period was 03/26/2017.          Sexually active: Yes.    The current method of family planning is status post hysterectomy.    Exercising: No.   Smoker:  no  Health Maintenance: Pap:  02/08/2014  History of abnormal Pap:  no MMG:  03/14/2020 Negative Colonoscopy:  08/12/2018, follow up 10 years BMD:  ordered TDaP:  unsure Shingrix:   discussed Hep C testing: will plan with future labs Screening Labs: 07/31/2021   reports that she has quit smoking. Her smoking use included cigarettes. She has a 8.00 pack-year smoking history. She has never used smokeless tobacco. She reports that she does not drink alcohol and does not use drugs.  Past Medical History:  Diagnosis Date   Arthritis of foot    R first MTP  (xray 04/2012)   Fibromyalgia    GERD (gastroesophageal reflux disease)    diet controlled - no meds   Herpes labialis    fever blisters when in the sun   High cholesterol    per pt--improved with diet/exercise   Hypotension    Iron deficiency anemia    Left shoulder pain    Low back pain    sees Dr. Lynann Bologna   Menorrhagia    Dr. Toney Rakes   Ovarian cyst    (surgery age 55)    Past Surgical History:  Procedure Laterality Date   APPENDECTOMY     55 YR OLD   BREAST BIOPSY Left 2019   fibroadenoma   CYSTOSCOPY N/A 06/11/2017   Procedure: CYSTOSCOPY;  Surgeon: Anastasio Auerbach, MD;  Location: [facility];  Service: Gynecology;  Laterality: N/A;   DILATATION & CURETTAGE/HYSTEROSCOPY WITH TRUECLEAR N/A 03/19/2014   Procedure: DILATATION & CURETTAGE/HYSTEROSCOPY WITH TRUCLEAR;  Surgeon: Terrance Mass, MD;  Location: [facility];  Service: Gynecology;  Laterality: N/A;  Resectoscopic Polypectomy   LAPAROSCOPIC VAGINAL HYSTERECTOMY WITH SALPINGO OOPHORECTOMY Bilateral 06/11/2017   Procedure: LAPAROSCOPIC ASSISTED VAGINAL HYSTERECTOMY WITH SALPINGO OOPHORECTOMY;  Surgeon: Anastasio Auerbach, MD;  Location: [facility];  Service: Gynecology;  Laterality: Bilateral;   OVARIAN CYST REMOVAL     laparotomy   pyloric stenosis repair  infant   WRIST GANGLION EXCISION      Current Outpatient Medications  Medication Sig Dispense Refill   fluticasone (FLONASE) 50 MCG/ACT nasal spray Place 2 sprays into both nostrils daily. 1 g 0   Multiple Vitamins-Minerals (MULTIVITAMIN WITH MINERALS) tablet Take 1 tablet by mouth daily.     valACYclovir (VALTREX) 1000 MG tablet Take 1,000 mg by mouth daily as needed (fever blister). Reported on 04/18/2016     No current facility-administered medications for this visit.    Family History  Problem Relation Age of Onset   Rheum arthritis Mother    Arthritis Mother    COPD Mother    Asthma Mother        child   Alcohol abuse Father    Heart disease Father 63    Hypertension Father    Irritable bowel syndrome Sister    Bipolar disorder Sister    Alcohol abuse Sister    Colon polyps Sister        teenager   Asthma Brother    Mental illness Maternal Aunt        fixed delusions   Hyperlipidemia Maternal Uncle    Hyperlipidemia Maternal Grandmother    Heart disease Maternal Grandmother        MI   Stroke Maternal Grandmother    Heart disease Paternal Grandmother 45       MI   Cancer Paternal Grandfather        lung cancer   GER disease Son    Diabetes Neg Hx    Breast cancer Neg Hx    Colon cancer Neg Hx    Esophageal cancer Neg Hx    Rectal cancer Neg Hx    Stomach cancer Neg Hx     Review of Systems  Constitutional: Negative.   Respiratory: Negative.    Cardiovascular:  Positive for palpitations.  Gastrointestinal: Negative.   Genitourinary: Negative.   Musculoskeletal: Negative.   Skin: Negative.   Neurological:  Positive for tremors (hand).  Psychiatric/Behavioral: Negative.     Exam:   BP (!) 111/58   Pulse 74   Ht 5' 4.5" (1.638 m)   Wt 206 lb 3.2 oz (93.5 kg)   LMP 03/26/2017   BMI 34.85 kg/m   Height: 5' 4.5" (163.8 cm)  General appearance: alert, cooperative and appears stated age Head: Normocephalic, without obvious abnormality, atraumatic Neck: no adenopathy, supple, symmetrical, trachea midline and thyroid normal to inspection and palpation Lungs: clear to auscultation bilaterally Breasts: normal appearance, no masses or tenderness Heart: regular rate and rhythm Abdomen: soft, non-tender; bowel sounds normal; no masses,  no organomegaly Extremities: extremities normal, atraumatic, no cyanosis or edema Skin: Skin color, texture, turgor normal. No rashes or lesions Lymph nodes: Cervical, supraclavicular, and axillary nodes normal. No abnormal inguinal nodes palpated Neurologic: Grossly normal   Pelvic: External genitalia:  no lesions              Urethra:  normal appearing urethra with no masses, tenderness  or lesions              Bartholins and Skenes: normal                 Vagina: normal appearing vagina with normal color and no discharge, no lesions              Cervix: absent              Pap taken: No. Bimanual Exam:  Uterus:  uterus absent  Adnexa: no mass, fullness, tenderness               Rectovaginal: Confirms               Anus:  normal sphincter tone, no lesions  Chaperone, Britt Bottom, CMA, was present for exam.  Assessment/Plan: 1. Well woman exam with routine gynecological exam - pap smears not indicated due to benign cervical pathology and negative hx of pap smears prior to hysterectomy - MMG 03/14/2020.  Order placed. - Plan BMD with MMG as well due to surgical menopause in her 45's - lab work reviewed.  Will plan Hep C with future lab work - vaccines updated.  Tdap given today  2. Surgical menopause - DG BONE DENSITY (DXA); Future  3. Palpitations - Ambulatory referral to Cardiology  4. Family history of cardiovascular disease  5. Elevated lipids  6. Essential tremor - Ambulatory referral to Neurology  7. Memory/attention to detail concerns - recommend pt consider evaluation for adult ADD/ADHD.  Information for Rachel Moulds, DO, given to pt.  Advised to let me know if needs referral placed.

## 2021-08-17 ENCOUNTER — Encounter (HOSPITAL_BASED_OUTPATIENT_CLINIC_OR_DEPARTMENT_OTHER): Payer: Self-pay | Admitting: Obstetrics & Gynecology

## 2021-08-17 DIAGNOSIS — R002 Palpitations: Secondary | ICD-10-CM | POA: Insufficient documentation

## 2021-08-17 DIAGNOSIS — G250 Essential tremor: Secondary | ICD-10-CM | POA: Insufficient documentation

## 2021-08-17 DIAGNOSIS — E8940 Asymptomatic postprocedural ovarian failure: Secondary | ICD-10-CM | POA: Insufficient documentation

## 2021-08-17 DIAGNOSIS — E785 Hyperlipidemia, unspecified: Secondary | ICD-10-CM | POA: Insufficient documentation

## 2021-08-17 DIAGNOSIS — Z8249 Family history of ischemic heart disease and other diseases of the circulatory system: Secondary | ICD-10-CM | POA: Insufficient documentation

## 2021-08-21 ENCOUNTER — Encounter: Payer: Self-pay | Admitting: Neurology

## 2021-08-29 ENCOUNTER — Other Ambulatory Visit: Payer: Self-pay

## 2021-08-29 ENCOUNTER — Encounter (HOSPITAL_BASED_OUTPATIENT_CLINIC_OR_DEPARTMENT_OTHER): Payer: Self-pay | Admitting: Cardiology

## 2021-08-29 ENCOUNTER — Ambulatory Visit (HOSPITAL_BASED_OUTPATIENT_CLINIC_OR_DEPARTMENT_OTHER): Payer: 59 | Admitting: Cardiology

## 2021-08-29 VITALS — BP 96/80 | HR 83 | Ht 65.0 in | Wt 204.0 lb

## 2021-08-29 DIAGNOSIS — R002 Palpitations: Secondary | ICD-10-CM | POA: Diagnosis not present

## 2021-08-29 DIAGNOSIS — Z7182 Exercise counseling: Secondary | ICD-10-CM

## 2021-08-29 DIAGNOSIS — E7800 Pure hypercholesterolemia, unspecified: Secondary | ICD-10-CM | POA: Diagnosis not present

## 2021-08-29 DIAGNOSIS — Z8249 Family history of ischemic heart disease and other diseases of the circulatory system: Secondary | ICD-10-CM | POA: Diagnosis not present

## 2021-08-29 DIAGNOSIS — Z7189 Other specified counseling: Secondary | ICD-10-CM

## 2021-08-29 DIAGNOSIS — Z713 Dietary counseling and surveillance: Secondary | ICD-10-CM

## 2021-08-29 NOTE — Progress Notes (Signed)
Cardiology Office Note:    Date:  08/29/2021   ID:  TARON CONREY, DOB May 03, 1966, MRN [id number]  PCP:  de [place], Raymond J, MD  Cardiologist:  Buford Dresser, MD  Referring MD: Megan Salon, MD   CC: new patient consultation for palpitations and CV risk assessment   History of Present Illness:    Sharon Simpson is a 55 y.o. female with a hx of arthritis, hyperlipidemia, and chronic borderline hypotension who is seen as a new consult at the request of Megan Salon, MD for the evaluation and management of palpitations, elevated lipids, and family history of CAD.  Palpitations: -Initial onset: 9 months ago, possibly longer -Frequency/Duration: Initially frequent (multiple times a week), not as frequent lately. Duration is not long. -Associated symptoms: "Takes her breath away," -Aggravating/alleviating factors: Emotional triggers (such as while watching a movie), Stress trigger, Palpitations may force her to cough which helps relieve symptoms  -Syncope/near syncope: None -Prior cardiac history: none -Caffeine: 1-2 glasses of hot tea, iced tea, Sprite -Tobacco: She quit smoking when she was 55 yo. 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 Typically too fatigued for formal exercise, tries to walk occasionally.  -Current diet: Home-cooked meals, may not have vegetables every day but does not feel she has a bad diet. Commonly eats salmon, chicken, almonds, olive oil, rice, potatoes, sweet potatoes. -Labs: TSH, kidney function/electrolytes, CBC reviewed. -Cardiac ROS: no chest pain, +shortness of breath, no PND, no orthopnea, no LE edema. -Family history: Both grandmothers died of heart attack. Maternal grandmother had a stroke, her siblings had strokes. Father has hypertension, mother has hypotension. Mother has rheumatoid arthritis.  Today, she reports she had thought her palpitations may be due to  menopause.  Recently she was infected with Covid. She took benadryl to help her sleep. At this time she has a lingering cough.  She endorses tremors for many years, that seem to have worsened lately.   She denies any chest pain. No lightheadedness, headaches, orthopnea, or PND. Also has no lower extremity edema or exertional symptoms.   Past Medical History:  Diagnosis Date   Arthritis of foot    R first MTP (xray 04/2012)   Fibromyalgia    GERD (gastroesophageal reflux disease)    diet controlled - no meds   Herpes labialis    fever blisters when in the sun   High cholesterol    per pt--improved with diet/exercise   Hypotension    Iron deficiency anemia    Left shoulder pain    Low back pain    sees Dr. Lynann Bologna   Menorrhagia    Dr. Toney Rakes   Ovarian cyst    (surgery age 74)    Past Surgical History:  Procedure Laterality Date   APPENDECTOMY     55 YR OLD   BREAST BIOPSY Left 2019   fibroadenoma   CYSTOSCOPY N/A 06/11/2017   Procedure: CYSTOSCOPY;  Surgeon: Anastasio Auerbach, MD;  Location: [facility];  Service: Gynecology;  Laterality: N/A;   DILATATION & CURETTAGE/HYSTEROSCOPY WITH TRUECLEAR N/A 03/19/2014   Procedure: DILATATION & CURETTAGE/HYSTEROSCOPY WITH TRUCLEAR;  Surgeon: Terrance Mass, MD;  Location: [facility];  Service: Gynecology;  Laterality: N/A;  Resectoscopic Polypectomy   LAPAROSCOPIC VAGINAL HYSTERECTOMY WITH SALPINGO OOPHORECTOMY Bilateral 06/11/2017   Procedure: LAPAROSCOPIC ASSISTED VAGINAL HYSTERECTOMY WITH SALPINGO OOPHORECTOMY;  Surgeon: Anastasio Auerbach, MD;  Location: [facility];  Service: Gynecology;  Laterality:  Bilateral;   OVARIAN CYST REMOVAL     laparotomy   pyloric stenosis repair  infant   WRIST GANGLION EXCISION      Current Medications: Current Outpatient Medications on File Prior to Visit  Medication Sig   Biotin 1000 MCG CHEW Chew 1 tablet by mouth daily as needed.   fluticasone (FLONASE) 50 MCG/ACT nasal spray Place 2 sprays into  both nostrils daily.   Multiple Vitamins-Minerals (MULTIVITAMIN WITH MINERALS) tablet Take 1 tablet by mouth daily.   valACYclovir (VALTREX) 1000 MG tablet Take 1,000 mg by mouth daily as needed (fever blister). Reported on 04/18/2016   No current facility-administered medications on file prior to visit.     Allergies:   Codeine, Latex, and Tetracyclines & related   Social History   Tobacco Use   Smoking status: Former    Packs/day: 1.00    Years: 8.00    Pack years: 8.00    Types: Cigarettes   Smokeless tobacco: Never   Tobacco comments:    quit age 89 (from age [age], 1 PPD)  Vaping Use   Vaping Use: Never used  Substance Use Topics   Alcohol use: No    Alcohol/week: 0.0 standard drinks   Drug use: No    Family History: family history includes Alcohol abuse in her father and sister; Arthritis in her mother; Asthma in her brother and mother; Bipolar disorder in her sister; COPD in her mother; Cancer in her paternal grandfather; Colon polyps in her sister; GER disease in her son; Heart disease in her maternal grandmother; Heart disease (age of onset: 58) in her father; Heart disease (age of onset: 2) in her paternal grandmother; Hyperlipidemia in her maternal grandmother and maternal uncle; Hypertension in her father; Irritable bowel syndrome in her sister; Mental illness in her maternal aunt; Rheum arthritis in her mother; Stroke in her maternal grandmother. There is no history of Diabetes, Breast cancer, Colon cancer, Esophageal cancer, Rectal cancer, or Stomach cancer.  ROS:   Please see the history of present illness.  Additional pertinent ROS: Constitutional: Positive for fatigue. Negative for chills, fever, night sweats, unintentional weight loss  HENT: Negative for ear pain and hearing loss.   Eyes: Negative for loss of vision and eye pain.  Respiratory: Positive for cough, shortness of breath. Negative for sputum, wheezing.   Cardiovascular: See HPI. Gastrointestinal:  Negative for abdominal pain, melena, and hematochezia.  Genitourinary: Negative for dysuria and hematuria.  Musculoskeletal: Negative for falls and myalgias.  Skin: Negative for itching and rash.  Neurological: Negative for focal weakness, focal sensory changes and loss of consciousness.  Endo/Heme/Allergies: Does not bruise/bleed easily.     EKGs/Labs/Other Studies Reviewed:    The following studies were reviewed today: No prior CV studies available.   EKG:  EKG is personally reviewed.   08/29/2021: NSR at 83 bpm, PRWP  Recent Labs: 07/31/2021: ALT 42; BUN 11; Creatinine, Ser 0.84; Hemoglobin 13.9; Platelets 308; Potassium 4.0; Sodium 140; TSH 2.210   Recent Lipid Panel    Component Value Date/Time   CHOL 246 (H) 07/31/2021 1119   TRIG 165 (H) 07/31/2021 1119   HDL 48 07/31/2021 1119   CHOLHDL 5.1 (H) 07/31/2021 1119   CHOLHDL 5 03/21/2020 1130   VLDL 25.2 03/21/2020 1130   LDLCALC 168 (H) 07/31/2021 1119   LDLDIRECT 161.0 05/07/2018 1101    Physical Exam:    VS:  BP 96/80   Pulse 83   Ht 5\' 5"  (1.651 m)   Wt 204  lb (92.5 kg)   LMP 03/26/2017   BMI 33.95 kg/m     Wt Readings from Last 3 Encounters:  08/29/21 204 lb (92.5 kg)  08/16/21 206 lb 3.2 oz (93.5 kg)  07/31/21 207 lb 3.2 oz (94 kg)    GEN: Well nourished, well developed in no acute distress HEENT: Normal, moist mucous membranes NECK: No JVD CARDIAC: regular rhythm, normal S1 and S2, no rubs or gallops. No murmur. VASCULAR: Radial and DP pulses 2+ bilaterally. No carotid bruits RESPIRATORY:  Clear to auscultation without rales, wheezing or rhonchi  ABDOMEN: Soft, non-tender, non-distended MUSCULOSKELETAL:  Ambulates independently SKIN: Warm and dry, no edema NEUROLOGIC:  Alert and oriented x 3. No focal neuro deficits noted. PSYCHIATRIC:  Normal affect    ASSESSMENT:    1. Heart palpitations   2. Family history of cardiovascular disease   3. Pure hypercholesterolemia   4. Cardiac risk counseling    5. Counseling on health promotion and disease prevention   6. Nutritional counseling   7. Exercise counseling    PLAN:    Palpitations: -improved recently -has apple watch, EKG has never shown afib -if symptoms worse, will send monitor for further evaluation -no high risk features  Hypercholesterolemia -discussed family history, risk factors at length -discussed pathology of cholesterol, how plaques form, that MI/CVA result commonly from acute plaque rupture and not gradual stenosis. Discussed mechanism of statin to both decrease plaque accumulation and stabilize plaque that is already present. Discussed that calcium is a marker for plaque, with decades of validated data regarding average amounts of calcium for age/gender/ethnicity, as well as value of calcium score for risk stratification  -after shared decision making, will proceed with calcium score for further evaluation -discussed lifestyle recommendations, below  Cardiac risk counseling and prevention recommendations: -recommend heart healthy/Mediterranean diet, with whole grains, fruits, vegetable, fish, lean meats, nuts, and olive oil. Limit salt. -recommend moderate walking, 3-5 times/week for 30-50 minutes each session. Aim for at least 150 minutes.week. Goal should be pace of 3 miles/hours, or walking 1.5 miles in 30 minutes -recommend avoidance of tobacco products. Avoid excess alcohol. -ASCVD risk score: The 10-year ASCVD risk score (Arnett DK, et al., 2019) is: 1.6%   Values used to calculate the score:     Age: 37 years     Sex: Female     Is Non-Hispanic African American: No     Diabetic: No     Tobacco smoker: No     Systolic Blood Pressure: 96 mmHg     Is BP treated: No     HDL Cholesterol: 48 mg/dL     Total Cholesterol: 246 mg/dL    Plan for follow up: to be determined based on results of calcium score. I would be happy to see her back as needed.  Buford Dresser, MD, PhD, [facility]    Medication Adjustments/Labs and Tests Ordered: Current medicines are reviewed at length with the patient today.  Concerns regarding medicines are outlined above.   Orders Placed This Encounter  Procedures   CT CARDIAC SCORING (SELF PAY ONLY)   EKG 12-Lead    No orders of the defined types were placed in this encounter.  Patient Instructions  Medication Instructions:  Your Physician recommend you continue on your current medication as directed.    *If you need a refill on your cardiac medications before your next appointment, please call your pharmacy*   Lab Work: None ordered today   Testing/Procedures: Dr.  Harrell Gave has ordered a CT coronary calcium score. This test is done at [address]. This is $99 out of pocket.   Coronary CalciumScan A coronary calcium scan is an imaging test used to look for deposits of calcium and other fatty materials (plaques) in the inner lining of the blood vessels of the heart (coronary arteries). These deposits of calcium and plaques can partly clog and narrow the coronary arteries without producing any symptoms or warning signs. This puts a person at risk for a heart attack. This test can detect these deposits before symptoms develop. Tell a health care provider about: Any allergies you have. All medicines you are taking, including vitamins, herbs, eye drops, creams, and over-the-counter medicines. Any problems you or family members have had with anesthetic medicines. Any blood disorders you have. Any surgeries you have had. Any medical conditions you have. Whether you are pregnant or may be pregnant. What are the risks? Generally, this is a safe procedure. However, problems may occur, including: Harm to a pregnant woman and her unborn baby. This test involves the use of radiation. Radiation exposure can be dangerous to a pregnant woman and her unborn baby. If you are pregnant, you generally should not have  this procedure done. Slight increase in the risk of cancer. This is because of the radiation involved in the test. What happens before the procedure? No preparation is needed for this procedure. What happens during the procedure? You will undress and remove any jewelry around your neck or chest. You will put on a hospital gown. Sticky electrodes will be placed on your chest. The electrodes will be connected to an electrocardiogram (ECG) machine to record a tracing of the electrical activity of your heart. A CT scanner will take pictures of your heart. During this time, you will be asked to lie still and hold your breath for 2-3 seconds while a picture of your heart is being taken. The procedure may vary among health care providers and hospitals. What happens after the procedure? You can get dressed. You can return to your normal activities. It is up to you to get the results of your test. Ask your health care provider, or the department that is doing the test, when your results will be ready. Summary A coronary calcium scan is an imaging test used to look for deposits of calcium and other fatty materials (plaques) in the inner lining of the blood vessels of the heart (coronary arteries). Generally, this is a safe procedure. Tell your health care provider if you are pregnant or may be pregnant. No preparation is needed for this procedure. A CT scanner will take pictures of your heart. You can return to your normal activities after the scan is done. This information is not intended to replace advice given to you by your health care provider. Make sure you discuss any questions you have with your health care provider. Document Released: 05/24/2008 Document Revised: 10/15/2016 Document Reviewed: 10/15/2016 Elsevier Interactive Patient Education  2017 [place]: At [facility], you and your health needs are our priority.  As part of our continuing mission to provide you  with exceptional heart care, we have created designated Provider Care Teams.  These Care Teams include your primary Cardiologist (physician) and Advanced Practice Providers (APPs -  Physician Assistants and Nurse Practitioners) who all work together to provide you with the care you need, when you need it.  We recommend signing up for the patient  portal called "MyChart".  Sign up information is provided on this After Visit Summary.  MyChart is used to connect with patients for Virtual Visits (Telemedicine).  Patients are able to view lab/test results, encounter notes, upcoming appointments, etc.  Non-urgent messages can be sent to your provider as well.   To learn more about what you can do with MyChart, go to [email or web address].    Your next appointment:   Based on test results   The format for your next appointment:   In Person  Provider:   Buford Dresser, MD      [facility] Stumpf,acting as a scribe for Buford Dresser, MD.,have documented all relevant documentation on the behalf of Buford Dresser, MD,as directed by  Buford Dresser, MD while in the presence of Buford Dresser, MD.  I, Buford Dresser, MD, have reviewed all documentation for this visit. The documentation on 08/29/21 for the exam, diagnosis, procedures, and orders are all accurate and complete.   Signed, Buford Dresser, MD PhD 08/29/2021 10:08 AM    [place]

## 2021-08-29 NOTE — Patient Instructions (Signed)
Medication Instructions:  Your Physician recommend you continue on your current medication as directed.    *If you need a refill on your cardiac medications before your next appointment, please call your pharmacy*   Lab Work: None ordered today   Testing/Procedures: Dr. Harrell Gave has ordered a CT coronary calcium score. This test is done at [address]. This is $99 out of pocket.   Coronary CalciumScan A coronary calcium scan is an imaging test used to look for deposits of calcium and other fatty materials (plaques) in the inner lining of the blood vessels of the heart (coronary arteries). These deposits of calcium and plaques can partly clog and narrow the coronary arteries without producing any symptoms or warning signs. This puts a person at risk for a heart attack. This test can detect these deposits before symptoms develop. Tell a health care provider about: Any allergies you have. All medicines you are taking, including vitamins, herbs, eye drops, creams, and over-the-counter medicines. Any problems you or family members have had with anesthetic medicines. Any blood disorders you have. Any surgeries you have had. Any medical conditions you have. Whether you are pregnant or may be pregnant. What are the risks? Generally, this is a safe procedure. However, problems may occur, including: Harm to a pregnant woman and her unborn baby. This test involves the use of radiation. Radiation exposure can be dangerous to a pregnant woman and her unborn baby. If you are pregnant, you generally should not have this procedure done. Slight increase in the risk of cancer. This is because of the radiation involved in the test. What happens before the procedure? No preparation is needed for this procedure. What happens during the procedure? You will undress and remove any jewelry around your neck or chest. You will put on a hospital gown. Sticky electrodes will be placed on  your chest. The electrodes will be connected to an electrocardiogram (ECG) machine to record a tracing of the electrical activity of your heart. A CT scanner will take pictures of your heart. During this time, you will be asked to lie still and hold your breath for 2-3 seconds while a picture of your heart is being taken. The procedure may vary among health care providers and hospitals. What happens after the procedure? You can get dressed. You can return to your normal activities. It is up to you to get the results of your test. Ask your health care provider, or the department that is doing the test, when your results will be ready. Summary A coronary calcium scan is an imaging test used to look for deposits of calcium and other fatty materials (plaques) in the inner lining of the blood vessels of the heart (coronary arteries). Generally, this is a safe procedure. Tell your health care provider if you are pregnant or may be pregnant. No preparation is needed for this procedure. A CT scanner will take pictures of your heart. You can return to your normal activities after the scan is done. This information is not intended to replace advice given to you by your health care provider. Make sure you discuss any questions you have with your health care provider. Document Released: 05/24/2008 Document Revised: 10/15/2016 Document Reviewed: 10/15/2016 Elsevier Interactive Patient Education  2017 [place]: At [facility], you and your health needs are our priority.  As part of our continuing mission to provide you with exceptional heart care, we have created designated Provider Care Teams.  These Care Teams include your primary Cardiologist (physician) and Advanced Practice Providers (APPs -  Physician Assistants and Nurse Practitioners) who all work together to provide you with the care you need, when you need it.  We recommend signing up for the patient portal called "MyChart".   Sign up information is provided on this After Visit Summary.  MyChart is used to connect with patients for Virtual Visits (Telemedicine).  Patients are able to view lab/test results, encounter notes, upcoming appointments, etc.  Non-urgent messages can be sent to your provider as well.   To learn more about what you can do with MyChart, go to [email or web address].    Your next appointment:   Based on test results   The format for your next appointment:   In Person  Provider:   Buford Dresser, MD

## 2021-09-18 ENCOUNTER — Other Ambulatory Visit: Payer: Self-pay

## 2021-09-18 ENCOUNTER — Ambulatory Visit (INDEPENDENT_AMBULATORY_CARE_PROVIDER_SITE_OTHER)
Admission: RE | Admit: 2021-09-18 | Discharge: 2021-09-18 | Disposition: A | Discharge status: Home / Self Care | Payer: Self-pay | Source: Ambulatory Visit | Attending: Cardiology | Admitting: Cardiology

## 2021-09-18 DIAGNOSIS — Z8249 Family history of ischemic heart disease and other diseases of the circulatory system: Secondary | ICD-10-CM

## 2021-09-18 DIAGNOSIS — E7800 Pure hypercholesterolemia, unspecified: Secondary | ICD-10-CM

## 2021-09-18 NOTE — Progress Notes (Signed)
Assessment/Plan:   1.  Essential Tremor.  -This is evidenced by the symmetrical nature and longstanding hx of gradually getting worse.  We discussed nature and pathophysiology.  We discussed that this can continue to gradually get worse with time.  We discussed that some medications can worsen this, as can caffeine use.  We discussed medication therapy as well as surgical therapy.  Ultimately, the patient decided to hold off on medication right now.  She will pay more attention to her caffeine use and will try to increase her water.  She is going to be more mindful of stress, particularly work stress and try to mitigate that.  She will think about the primidone.  We decided that propranolol would not be a good idea given her lower blood pressure. 2.  Meralgia paresthetica, left  -Discussed nature and pathophysiology.  She asked if that would cause any tremor, but I told her that would not.  We discussed that even a mild degree of weight loss, potentially 5 or 10 pounds would be of value for this.  Subjective:   Sharon Simpson was seen in consultation in the movement disorder clinic at the request of Megan Salon, MD.  The evaluation is for tremor.  Tremor started in her 20's but seems like it has gotten worse over the years.  tremor involves the bilateral UE. Never in the legs.  Sometimes she will feel inner tremor.  Tremor is most noticeable when holding objects (plates, drinking out of a cup).   There is a family hx of tremor in her maternal cousins (2nd cousins/3rd cousins).    Affected by caffeine:  unknown (drink mountain dew/coke/iced tea) Affected by alcohol:  doesn't drink alcohol Affected by stress:  Yes.   Affected by fatigue:  Yes.   Spills soup if on spoon:  No. (Has to be careful) Spills glass of liquid if full:  No. Affects ADL's (tying shoes, brushing teeth, etc):  No. Notices some swallow trouble, esp when under stress.  Its much better but still notes it occasionally.  She  will feel it mostly with stress.  Food is able to be swallowed well.    Notes that she does have a history of what sounds like meralgia paresthetica on the left.  Current/Previously tried tremor medications: none  Current medications that may exacerbate tremor:  n/a  Outside reports reviewed: historical medical records, office notes, and referral letter/letters.  Allergies  Allergen Reactions   Codeine Rash   Latex Rash   Tetracyclines & Related Rash    Current Outpatient Medications  Medication Instructions   Biotin 1000 MCG CHEW 1 tablet, Oral, Daily PRN   fluticasone (FLONASE) 50 MCG/ACT nasal spray 2 sprays, Each Nare, Daily   Multiple Vitamins-Minerals (MULTIVITAMIN WITH MINERALS) tablet 1 tablet, Oral, Daily   valACYclovir (VALTREX) 1,000 mg, Oral, Daily PRN, Reported on 04/18/2016     Objective:   VITALS:   Vitals:   09/21/21 1332  BP: (!) 116/59  Pulse: 82  SpO2: 95%  Weight: 204 lb (92.5 kg)  Height: 5\' 5"  (1.651 m)   Gen:  Appears stated age and in NAD. HEENT:  Normocephalic, atraumatic. The mucous membranes are moist. The superficial temporal arteries are without ropiness or tenderness. Cardiovascular: Regular rate and rhythm. Lungs: Clear to auscultation bilaterally. Neck: There are no carotid bruits noted bilaterally.  NEUROLOGICAL:  Orientation:  The patient is alert and oriented x 3.   Cranial nerves: There is good facial symmetry. Extraocular muscles are  intact and visual fields are full to confrontational testing. Speech is fluent and clear. Hearing is intact to conversational tone. Tone: Tone is good throughout. Sensation: Sensation is intact to light touch touch throughout (facial, trunk, extremities). Vibration is intact at the bilateral big toe. There is no extinction with double simultaneous stimulation. There is no sensory dermatomal level identified. Coordination:  The patient has no dysdiadichokinesia or dysmetria. Motor: Strength is 5/5 in  the bilateral upper and lower extremities.  Shoulder shrug is equal bilaterally.  There is no pronator drift.  There are no fasciculations noted. DTR's: Deep tendon reflexes are 2/4 at the bilateral biceps, triceps, brachioradialis, patella and achilles.  Plantar responses are downgoing bilaterally. Gait and Station: The patient is able to ambulate without difficulty. The patient is able to ambulate in a tandem fashion. The patient is able to stand in the Romberg position.   MOVEMENT EXAM: Tremor:  There is tremor in the UE, noted most significantly with action.  The patient is able to draw Archimedes spirals without significant difficulty.  There is no tremor at rest.  The patient is able to pour water from one glass to another without spilling it but she is somewhat tremulous.  I have reviewed and interpreted the following labs independently   Chemistry      Component Value Date/Time   NA 140 07/31/2021 1119   K 4.0 07/31/2021 1119   CL 102 07/31/2021 1119   CO2 22 07/31/2021 1119   BUN 11 07/31/2021 1119   CREATININE 0.84 07/31/2021 1119      Component Value Date/Time   CALCIUM 9.7 07/31/2021 1119   ALKPHOS 57 07/31/2021 1119   AST 32 07/31/2021 1119   ALT 42 (H) 07/31/2021 1119   BILITOT 0.4 07/31/2021 1119      Lab Results  Component Value Date   WBC 6.3 07/31/2021   HGB 13.9 07/31/2021   HCT 40.3 07/31/2021   MCV 85 07/31/2021   PLT 308 07/31/2021   Lab Results  Component Value Date   TSH 2.210 07/31/2021      Total time spent on today's visit was 54minutes, including both face-to-face time and nonface-to-face time.  Time included that spent on review of records (prior notes available to me/labs/imaging if pertinent), discussing treatment and goals, answering patient's questions and coordinating care.  CC:  [place], Blondell Reveal, MD

## 2021-09-21 ENCOUNTER — Ambulatory Visit: Payer: 59 | Admitting: Neurology

## 2021-09-21 ENCOUNTER — Encounter: Payer: Self-pay | Admitting: Neurology

## 2021-09-21 ENCOUNTER — Other Ambulatory Visit: Payer: Self-pay

## 2021-09-21 VITALS — BP 116/59 | HR 82 | Ht 65.0 in | Wt 204.0 lb

## 2021-09-21 DIAGNOSIS — G250 Essential tremor: Secondary | ICD-10-CM

## 2021-09-21 DIAGNOSIS — G5712 Meralgia paresthetica, left lower limb: Secondary | ICD-10-CM

## 2021-09-21 NOTE — Patient Instructions (Signed)
Let us know if you decide you want primidone.  In the meantime, you will decrease your caffeine, increase your water, and work on stress mitigation.  It was my pleasure to see you today.  The physicians and staff at [facility] are committed to providing excellent care. You may receive a survey requesting feedback about your experience at our office. We strive to receive "very good" responses to the survey questions. If you feel that your experience would prevent you from giving the office a "very good " response, please contact our office to try to remedy the situation. We may be reached at [phone]. Thank you for taking the time out of your busy day to complete the survey. Essential Tremor A tremor is trembling or shaking that a person cannot control. Most tremors affect the hands or arms. Tremors can also affect the head, vocal cords, legs, and other parts of the body. Essential tremor is a tremor without a known cause. Usually, it occurs while a person is trying to perform an action. It tends to get worse gradually as a person ages. What are the causes? The cause of this condition is not known. What increases the risk? You are more likely to develop this condition if: You have a family member with essential tremor. You are age 82 or older. You take certain medicines. What are the signs or symptoms? The main sign of a tremor is a rhythmic shaking of certain parts of your body that is uncontrolled and unintentional. You may: Have difficulty eating with a spoon or fork. Have difficulty writing. Nod your head up and down or side to side. Have a quivering voice. The shaking may: Get worse over time. Come and go. Be more noticeable on one side of your body. Get worse due to stress, fatigue, caffeine, and extreme heat or cold. How is this diagnosed? This condition may be diagnosed based on: Your symptoms and medical history. A physical exam. There is no single test to diagnose  an essential tremor. However, your health care provider may order tests to rule out other causes of your condition. These may include: Blood and urine tests. Imaging studies of your brain, such as CT scan and MRI. A test that measures involuntary muscle movement (electromyogram). How is this treated? Treatment for essential tremor depends on the severity of the condition. Some tremors may go away without treatment. Mild tremors may not need treatment if they do not affect your day-to-day life. Severe tremors may need to be treated using one or more of the following options: Medicines. Lifestyle changes. Occupational or physical therapy. Follow these instructions at home: Lifestyle  Do not use any products that contain nicotine or tobacco, such as cigarettes and e-cigarettes. If you need help quitting, ask your health care provider. Limit your caffeine intake as told by your health care provider. Try to get 8 hours of sleep each night. Find ways to manage your stress that fits your lifestyle and personality. Consider trying meditation or yoga. Try to anticipate stressful situations and allow extra time to manage them. If you are struggling emotionally with the effects of your tremor, consider working with a mental health provider. General instructions Take over-the-counter and prescription medicines only as told by your health care provider. Avoid extreme heat and extreme cold. Keep all follow-up visits as told by your health care provider. This is important. Visits may include physical therapy visits. Contact a health care provider if: You experience any changes in the location or  intensity of your tremors. You start having a tremor after starting a new medicine. You have tremor with other symptoms, such as: Numbness. Tingling. Pain. Weakness. Your tremor gets worse. Your tremor interferes with your daily life. You feel down, blue, or sad for at least 2 weeks in a row. Worrying  about your tremor and what other people think about you interferes with your everyday life functions, including relationships, work, or school. Summary Essential tremor is a tremor without a known cause. Usually, it occurs when you are trying to perform an action. You are more likely to develop this condition if you have a family member with essential tremor. The main sign of a tremor is a rhythmic shaking of certain parts of your body that is uncontrolled and unintentional. Treatment for essential tremor depends on the severity of the condition. This information is not intended to replace advice given to you by your health care provider. Make sure you discuss any questions you have with your health care provider. Document Revised: 08/19/2020 Document Reviewed: 08/19/2020 Elsevier Patient Education  2022 [employer].

## 2021-09-28 ENCOUNTER — Other Ambulatory Visit: Payer: Self-pay

## 2021-09-28 ENCOUNTER — Ambulatory Visit (HOSPITAL_BASED_OUTPATIENT_CLINIC_OR_DEPARTMENT_OTHER)
Admission: RE | Admit: 2021-09-28 | Discharge: 2021-09-28 | Disposition: A | Discharge status: Home / Self Care | Payer: 59 | Source: Ambulatory Visit | Attending: Obstetrics & Gynecology | Admitting: Obstetrics & Gynecology

## 2021-09-28 DIAGNOSIS — Z1231 Encounter for screening mammogram for malignant neoplasm of breast: Secondary | ICD-10-CM | POA: Diagnosis not present

## 2021-09-28 DIAGNOSIS — Z780 Asymptomatic menopausal state: Secondary | ICD-10-CM | POA: Diagnosis not present

## 2021-09-28 DIAGNOSIS — G250 Essential tremor: Secondary | ICD-10-CM | POA: Diagnosis not present

## 2021-09-28 DIAGNOSIS — E8940 Asymptomatic postprocedural ovarian failure: Secondary | ICD-10-CM | POA: Diagnosis not present

## 2022-02-06 IMAGING — MG DIGITAL SCREENING BILAT W/ TOMO W/ CAD
8 series · 8 of 24 positions shown · non-contrast
Comparison: Previous exam(s).

CLINICAL DATA: Screening.

EXAM:
DIGITAL SCREENING BILATERAL MAMMOGRAM WITH TOMO AND CAD

[R MLO synth-2D]
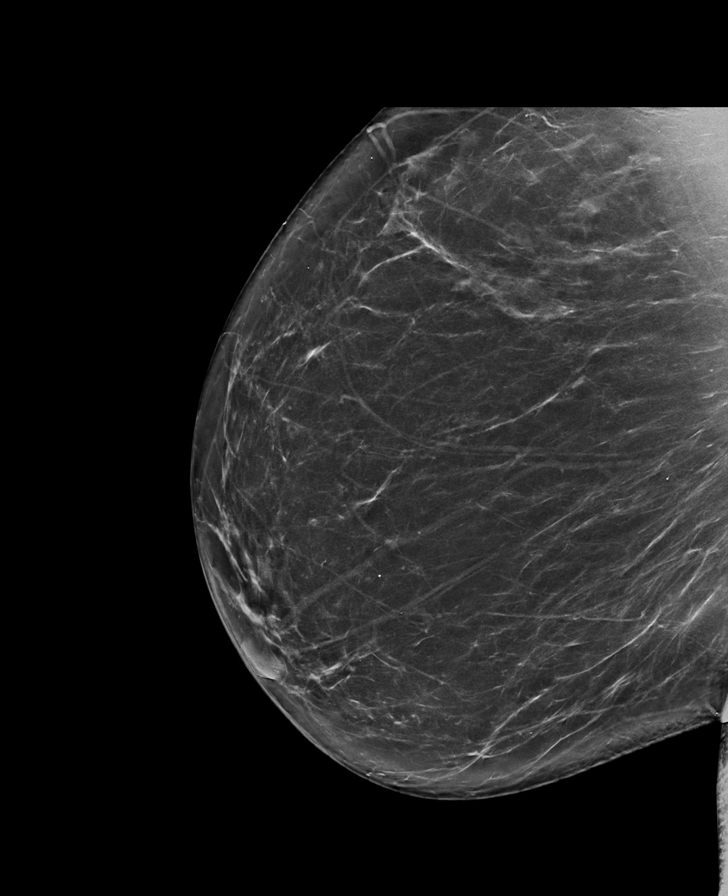

[L MLO synth-2D]
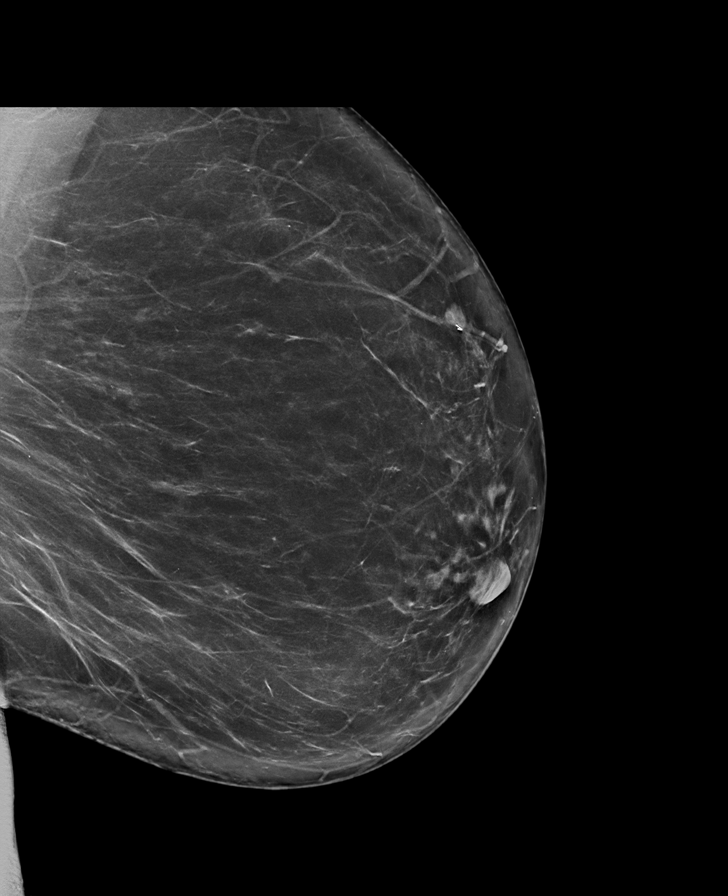

[R CC synth-2D]
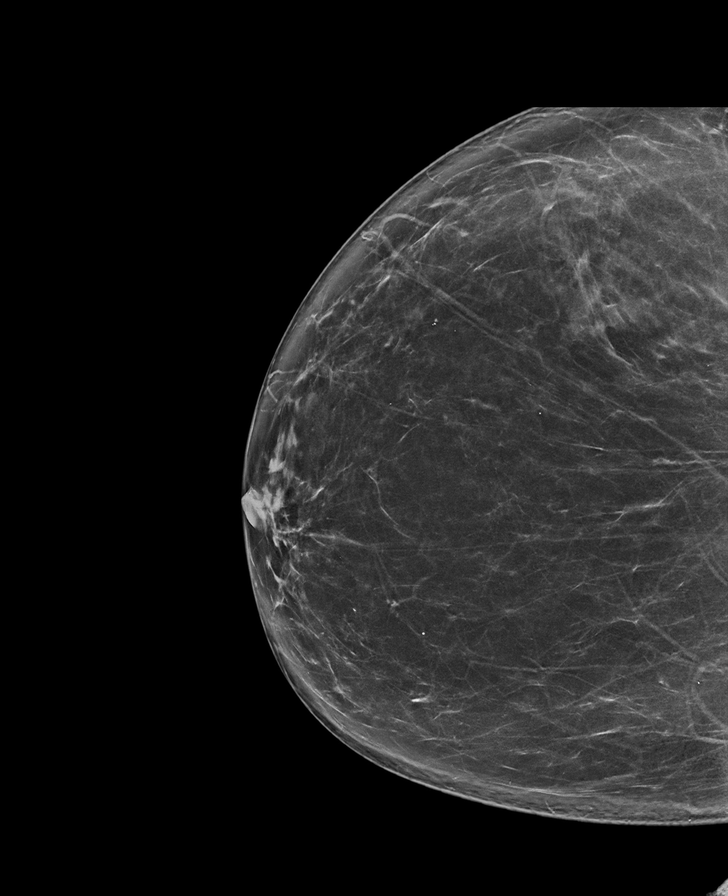

[L CC synth-2D]
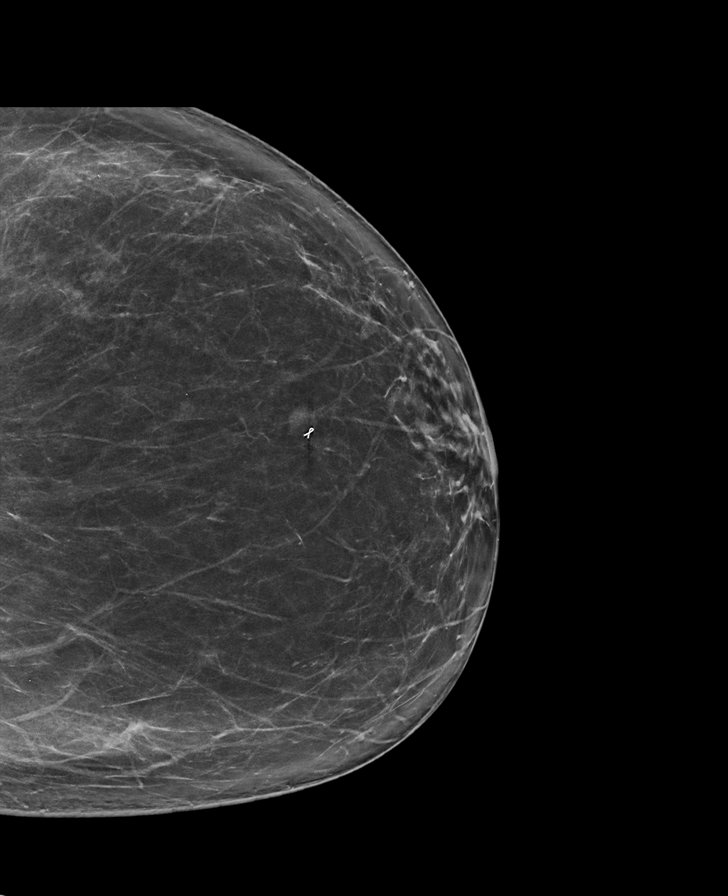

[L CC tomo · tomo slice 43/84.0]
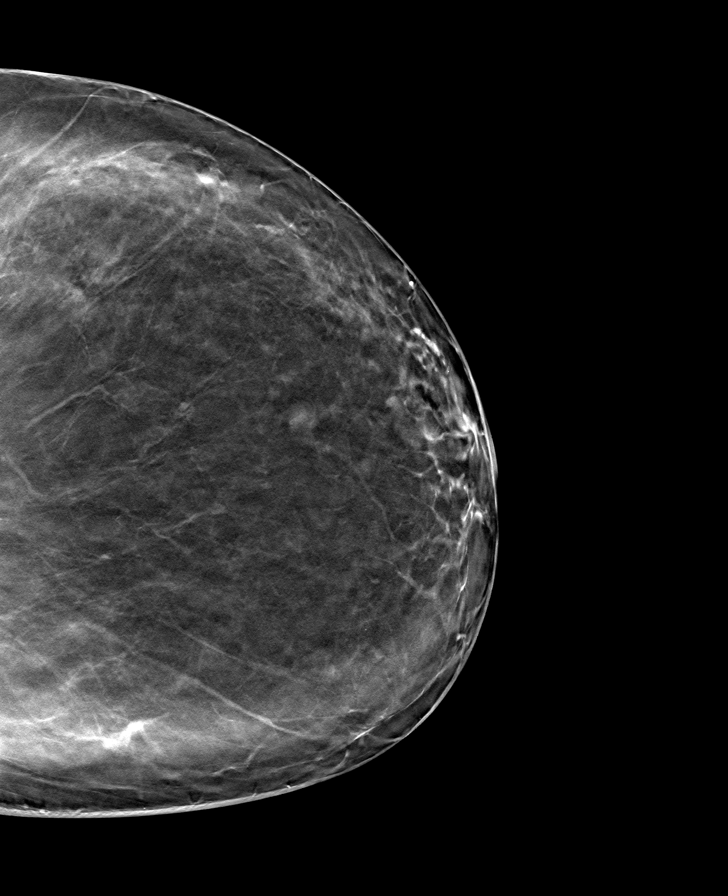

[L MLO tomo · tomo slice 46/91.0]
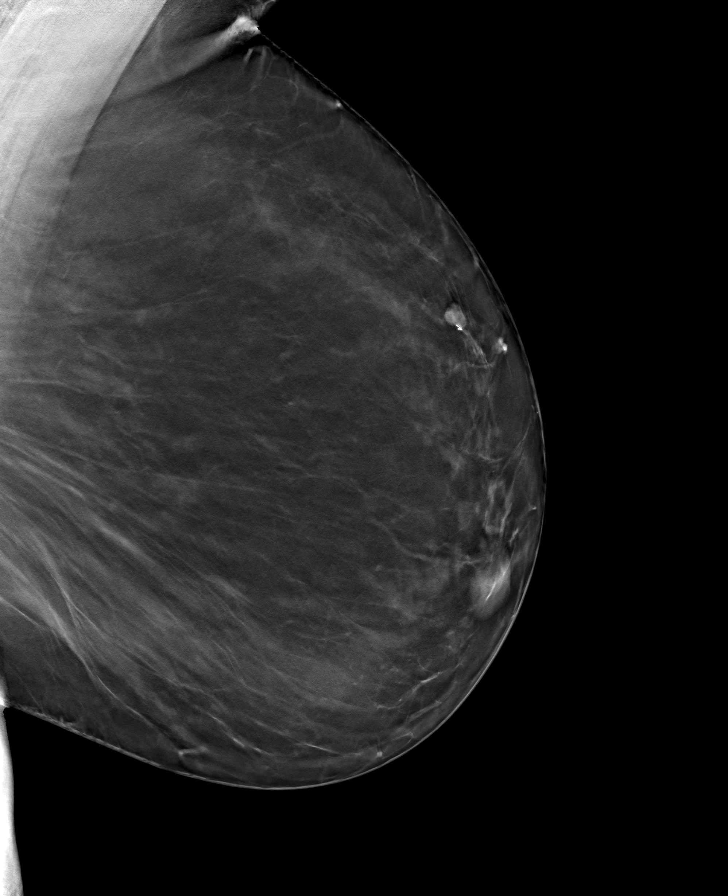

[R CC tomo · tomo slice 43/84.0]
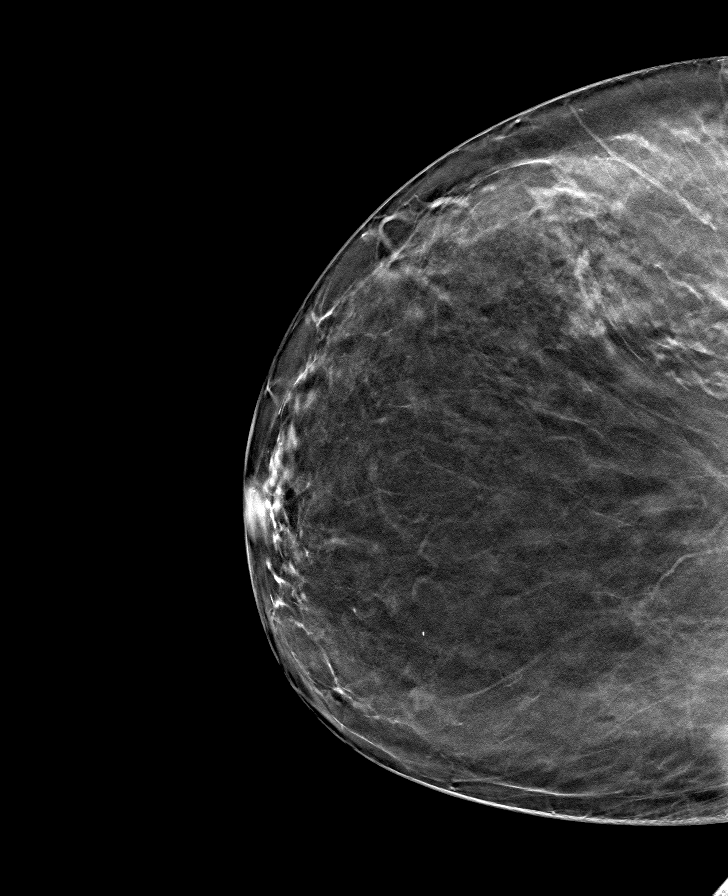

[R MLO tomo · tomo slice 45/89.0]
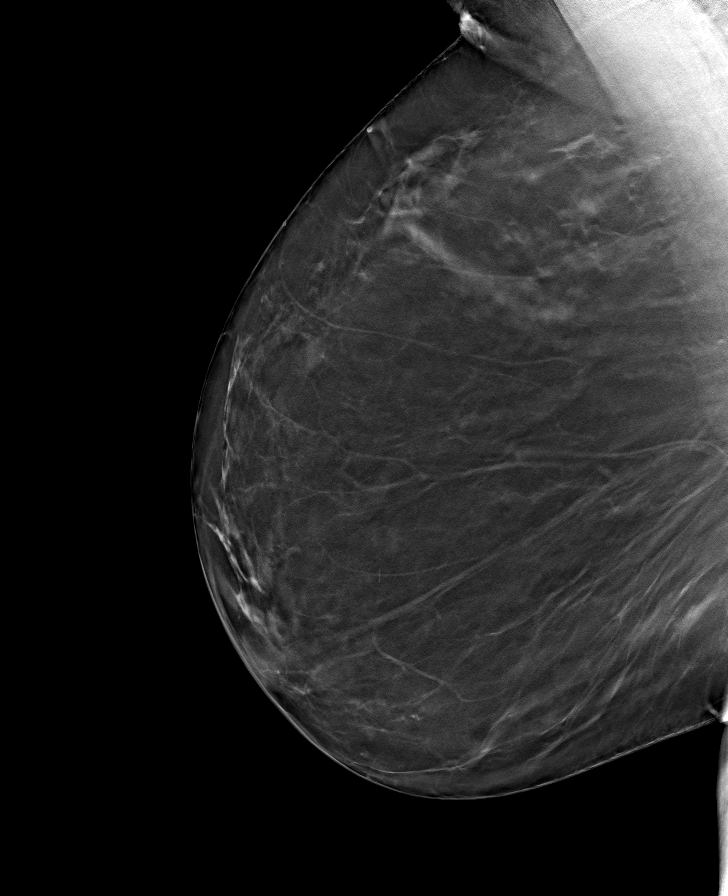

[8 of 24 positions shown; findings below may reference images not displayed]

ACR Breast Density Category b: There are scattered areas of
fibroglandular density.
FINDINGS: There are no findings suspicious for malignancy. Images were
processed with CAD.
IMPRESSION: No mammographic evidence of malignancy. A result letter of this
screening mammogram will be mailed directly to the patient.

RECOMMENDATION:
Screening mammogram in one year. (Code:[id number])

BI-RADS CATEGORY  1: Negative.

## 2022-08-07 ENCOUNTER — Encounter (HOSPITAL_BASED_OUTPATIENT_CLINIC_OR_DEPARTMENT_OTHER): Payer: Self-pay | Admitting: Family Medicine

## 2022-08-07 ENCOUNTER — Other Ambulatory Visit (HOSPITAL_BASED_OUTPATIENT_CLINIC_OR_DEPARTMENT_OTHER): Payer: Self-pay

## 2022-08-07 ENCOUNTER — Ambulatory Visit (INDEPENDENT_AMBULATORY_CARE_PROVIDER_SITE_OTHER): Payer: 59 | Admitting: Family Medicine

## 2022-08-07 VITALS — BP 123/65 | HR 78 | Temp 97.8°F | Ht 65.0 in | Wt 200.6 lb

## 2022-08-07 DIAGNOSIS — Z0000 Encounter for general adult medical examination without abnormal findings: Secondary | ICD-10-CM

## 2022-08-07 DIAGNOSIS — Z23 Encounter for immunization: Secondary | ICD-10-CM

## 2022-08-07 MED ORDER — SHINGRIX 50 MCG/0.5ML IM SUSR
0.5000 mL | Freq: Once | INTRAMUSCULAR | 0 refills | Status: AC
  Filled 2022-08-07: qty 0.5, 1d supply, fill #0

## 2022-08-07 NOTE — Assessment & Plan Note (Signed)
Routine HCM labs ordered. HCM reviewed/discussed. Anticipatory guidance regarding healthy weight, lifestyle and choices given. Recommend healthy diet.  Recommend approximately 150 minutes/week of moderate intensity exercise Recommend regular dental and vision exams Always use seatbelt/lap and shoulder restraints Recommend using smoke alarms and checking batteries at least twice a year Recommend using sunscreen when outside Discussed colon cancer screening recommendations, options.  Patient will consider and let us know how he would like to proceed Discussed recommendations for shingles vaccine.  Patient declines at this time, will consider Discussed tetanus immunization recommendations, patient agreed to proceed with this today

## 2022-08-07 NOTE — Progress Notes (Signed)
Subjective:    CC: Annual Physical Exam  HPI:  Sharon Simpson is a 56 y.o. presenting for annual physical  I reviewed the past medical history, family history, social history, surgical history, and allergies today and no changes were needed.  Please see the problem list section below in epic for further details.  Past Medical History: Past Medical History:  Diagnosis Date   Arthritis of foot    R first MTP (xray 04/2012)   Fibromyalgia    GERD (gastroesophageal reflux disease)    diet controlled - no meds   Herpes labialis    fever blisters when in the sun   High cholesterol    per pt--improved with diet/exercise   Hypotension    Iron deficiency anemia    Left shoulder pain    Low back pain    sees Dr. Lynann Bologna   Menorrhagia    Dr. Toney Rakes   Ovarian cyst    (surgery age 74)   Past Surgical History: Past Surgical History:  Procedure Laterality Date   APPENDECTOMY     56 YR OLD   BREAST BIOPSY Left 2019   fibroadenoma   CYSTOSCOPY N/A 06/11/2017   Procedure: CYSTOSCOPY;  Surgeon: Anastasio Auerbach, MD;  Location: [facility];  Service: Gynecology;  Laterality: N/A;   DILATATION & CURETTAGE/HYSTEROSCOPY WITH TRUECLEAR N/A 03/19/2014   Procedure: DILATATION & CURETTAGE/HYSTEROSCOPY WITH TRUCLEAR;  Surgeon: Terrance Mass, MD;  Location: [facility];  Service: Gynecology;  Laterality: N/A;  Resectoscopic Polypectomy   LAPAROSCOPIC VAGINAL HYSTERECTOMY WITH SALPINGO OOPHORECTOMY Bilateral 06/11/2017   Procedure: LAPAROSCOPIC ASSISTED VAGINAL HYSTERECTOMY WITH SALPINGO OOPHORECTOMY;  Surgeon: Anastasio Auerbach, MD;  Location: [facility];  Service: Gynecology;  Laterality: Bilateral;   OVARIAN CYST REMOVAL     laparotomy   pyloric stenosis repair  infant   WRIST GANGLION EXCISION     Social History: Social History   Socioeconomic History   Marital status: Married    Spouse name: Not on file   Number of children: 3   Years of education: Not on file   Highest education level:  Not on file  Occupational History   Occupation: Surveyor, 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   Daughter is 3 rd physiatry resident in [place]   Son lives in [place], [place] grandchild    [place] son is at [employer] of [employer] broadcast assistant Strain: Not on [employer] Insecurity: Not on file  Transportation Needs: Not on file  Physical Activity: Not on file  Stress: Not on file  Social Connections: Not on file   Family History: Family History  Problem Relation Age of Onset   Rheum arthritis Mother    Arthritis Mother    COPD Mother  Asthma Mother        child   Alcohol abuse Father    Heart disease Father 8   Hypertension Father    Irritable bowel syndrome Sister    Bipolar disorder Sister    Alcohol abuse Sister    Colon polyps Sister        teenager   Asthma Brother    Mental illness Maternal Aunt        fixed delusions   Hyperlipidemia Maternal Uncle    Hyperlipidemia Maternal Grandmother    Heart disease Maternal Grandmother        MI   Stroke Maternal Grandmother    Heart disease Paternal Grandmother 62       MI   Cancer Paternal Grandfather        lung cancer   GER disease Son    Diabetes Neg Hx    Breast cancer Neg Hx    Colon cancer Neg Hx    Esophageal cancer Neg Hx    Rectal cancer Neg Hx    Stomach cancer Neg Hx    Allergies: Allergies  Allergen Reactions    Codeine Rash   Latex Rash   Tetracyclines & Related Rash   Medications: See med rec.  Review of Systems: No headache, visual changes, nausea, vomiting, diarrhea, constipation, dizziness, abdominal pain, skin rash, fevers, chills, night sweats, swollen lymph nodes, weight loss, chest pain, body aches, joint swelling, muscle aches, shortness of breath, mood changes, visual or auditory hallucinations.  Objective:    BP 123/65   Pulse 78   Temp 97.8 F (36.6 C) (Oral)   Ht '5\' 5"'$  (1.651 m)   Wt 200 lb 9.6 oz (91 kg)   LMP 03/20/2017   SpO2 99%   BMI 33.38 kg/m   General: Well Developed, well nourished, and in no acute distress.  Neuro: Alert and oriented x3, extra-ocular muscles intact, sensation grossly intact. Cranial nerves II through XII are intact, motor, sensory, and coordinative functions are all intact. HEENT: Normocephalic, atraumatic, pupils equal round reactive to light, neck supple, no masses, no lymphadenopathy, thyroid nonpalpable. Oropharynx, nasopharynx, external ear canals are unremarkable. Skin: Warm and dry, no rashes noted.  Cardiac: Regular rate and rhythm, no murmurs rubs or gallops.  Respiratory: Clear to auscultation bilaterally. Not using accessory muscles, speaking in full sentences.  Abdominal: Soft, nontender, nondistended, positive bowel sounds, no masses, no organomegaly.  Musculoskeletal: Shoulder, elbow, wrist, hip, knee, ankle stable, and with full range of motion.  Impression and Recommendations:    Wellness examination Routine HCM labs ordered. HCM reviewed/discussed. Anticipatory guidance regarding healthy weight, lifestyle and choices given. Recommend healthy diet.  Recommend approximately 150 minutes/week of moderate intensity exercise Recommend regular dental and vision exams Always use seatbelt/lap and shoulder restraints Recommend using smoke alarms and checking batteries at least twice a year Recommend using sunscreen when outside Discussed  colon cancer screening recommendations, options.  Patient will consider and let us know how he would like to proceed Discussed recommendations for shingles vaccine.  Patient declines at this time, will consider Discussed tetanus immunization recommendations, patient agreed to proceed with this today  Return in about 1 year (around 08/08/2023) for CPE.   ___________________________________________ Lacrecia Delval de [place], MD, ABFM, CAQSM [facility] [place]

## 2022-08-07 NOTE — Patient Instructions (Signed)
  Medication Instructions:  Your physician recommends that you continue on your current medications as directed. Please refer to the Current Medication list given to you today. --If you need a refill on any your medications before your next appointment, please call your pharmacy first. If no refills are authorized on file call the office.-- Lab Work: Your physician has recommended that you have lab work today: No If you have labs (blood work) drawn today and your tests are completely normal, you will receive your results via MyChart message OR a phone call from our staff.  Please ensure you check your voicemail in the event that you authorized detailed messages to be left on a delegated number. If you have any lab test that is abnormal or we need to change your treatment, we will call you to review the results.  Referrals/Procedures/Imaging: No  Follow-Up: Your next appointment:   Your physician recommends that you schedule a follow-up appointment in: 1 year cpe with Dr. de [place].  You will receive a text message or e-mail with a link to a survey about your care and experience with us today! We would greatly appreciate your feedback!   Thanks for letting us be apart of your health journey!!  Primary Care and Sports Medicine   Dr. Raymond de [place]   We encourage you to activate your patient portal called "MyChart".  Sign up information is provided on this After Visit Summary.  MyChart is used to connect with patients for Virtual Visits (Telemedicine).  Patients are able to view lab/test results, encounter notes, upcoming appointments, etc.  Non-urgent messages can be sent to your provider as well. To learn more about what you can do with MyChart, please visit --  [email or web address].    

## 2023-01-24 ENCOUNTER — Ambulatory Visit (HOSPITAL_BASED_OUTPATIENT_CLINIC_OR_DEPARTMENT_OTHER): Payer: Commercial Managed Care - PPO

## 2023-01-24 DIAGNOSIS — Z0000 Encounter for general adult medical examination without abnormal findings: Secondary | ICD-10-CM

## 2023-01-24 DIAGNOSIS — F4320 Adjustment disorder, unspecified: Secondary | ICD-10-CM | POA: Diagnosis not present

## 2023-01-25 LAB — COMPREHENSIVE METABOLIC PANEL
ALT: 31 IU/L (ref 0–32)
AST: 24 IU/L (ref 0–40)
Albumin/Globulin Ratio: 1.9 (ref 1.2–2.2)
Albumin: 4.5 g/dL (ref 3.8–4.9)
Alkaline Phosphatase: 53 IU/L (ref 44–121)
BUN/Creatinine Ratio: 13 (ref 9–23)
BUN: 11 mg/dL (ref 6–24)
Bilirubin Total: 0.6 mg/dL (ref 0.0–1.2)
CO2: 22 mmol/L (ref 20–29)
Calcium: 9.3 mg/dL (ref 8.7–10.2)
Chloride: 104 mmol/L (ref 96–106)
Creatinine, Ser: 0.82 mg/dL (ref 0.57–1.00)
Globulin, Total: 2.4 g/dL (ref 1.5–4.5)
Glucose: 106 mg/dL — ABNORMAL HIGH (ref 70–99)
Potassium: 3.9 mmol/L (ref 3.5–5.2)
Sodium: 142 mmol/L (ref 134–144)
Total Protein: 6.9 g/dL (ref 6.0–8.5)
eGFR: 83 mL/min/{1.73_m2} (ref >59)

## 2023-01-25 LAB — LIPID PANEL
Chol/HDL Ratio: 5.3 ratio — ABNORMAL HIGH (ref 0.0–4.4)
Cholesterol, Total: 238 mg/dL — ABNORMAL HIGH (ref 100–199)
HDL: 45 mg/dL (ref >39)
LDL Chol Calc (NIH): 164 mg/dL — ABNORMAL HIGH (ref 0–99)
Triglycerides: 156 mg/dL — ABNORMAL HIGH (ref 0–149)
VLDL Cholesterol Cal: 29 mg/dL (ref 5–40)

## 2023-01-25 LAB — TSH RFX ON ABNORMAL TO FREE T4: TSH: 3.07 u[IU]/mL (ref 0.450–4.500)

## 2023-01-25 LAB — CBC WITH DIFFERENTIAL/PLATELET
Basophils Absolute: 0 10*3/uL (ref 0.0–0.2)
Basos: 1 %
EOS (ABSOLUTE): 0.1 10*3/uL (ref 0.0–0.4)
Eos: 2 %
Hematocrit: 44.4 % (ref 34.0–46.6)
Hemoglobin: 14.3 g/dL (ref 11.1–15.9)
Immature Grans (Abs): 0 10*3/uL (ref 0.0–0.1)
Immature Granulocytes: 0 %
Lymphocytes Absolute: 2.5 10*3/uL (ref 0.7–3.1)
Lymphs: 33 %
MCH: 28.8 pg (ref 26.6–33.0)
MCHC: 32.2 g/dL (ref 31.5–35.7)
MCV: 90 fL (ref 79–97)
Monocytes Absolute: 0.4 10*3/uL (ref 0.1–0.9)
Monocytes: 6 %
Neutrophils Absolute: 4.5 10*3/uL (ref 1.4–7.0)
Neutrophils: 58 %
Platelets: 306 10*3/uL (ref 150–450)
RBC: 4.96 x10E6/uL (ref 3.77–5.28)
RDW: 12.4 % (ref 11.7–15.4)
WBC: 7.6 10*3/uL (ref 3.4–10.8)

## 2023-01-25 LAB — HEMOGLOBIN A1C
Est. average glucose Bld gHb Est-mCnc: 117 mg/dL
Hgb A1c MFr Bld: 5.7 % — ABNORMAL HIGH (ref 4.8–5.6)

## 2023-02-21 DIAGNOSIS — Z85828 Personal history of other malignant neoplasm of skin: Secondary | ICD-10-CM | POA: Diagnosis not present

## 2023-02-21 DIAGNOSIS — L821 Other seborrheic keratosis: Secondary | ICD-10-CM | POA: Diagnosis not present

## 2023-02-21 DIAGNOSIS — L820 Inflamed seborrheic keratosis: Secondary | ICD-10-CM | POA: Diagnosis not present

## 2023-03-18 DIAGNOSIS — F4320 Adjustment disorder, unspecified: Secondary | ICD-10-CM | POA: Diagnosis not present

## 2023-03-18 DIAGNOSIS — Z630 Problems in relationship with spouse or partner: Secondary | ICD-10-CM | POA: Diagnosis not present

## 2023-04-01 DIAGNOSIS — Z630 Problems in relationship with spouse or partner: Secondary | ICD-10-CM | POA: Diagnosis not present

## 2023-04-01 DIAGNOSIS — F4320 Adjustment disorder, unspecified: Secondary | ICD-10-CM | POA: Diagnosis not present

## 2023-04-18 DIAGNOSIS — F4320 Adjustment disorder, unspecified: Secondary | ICD-10-CM | POA: Diagnosis not present

## 2023-04-18 DIAGNOSIS — Z630 Problems in relationship with spouse or partner: Secondary | ICD-10-CM | POA: Diagnosis not present

## 2023-04-29 DIAGNOSIS — F4320 Adjustment disorder, unspecified: Secondary | ICD-10-CM | POA: Diagnosis not present

## 2023-04-29 DIAGNOSIS — Z630 Problems in relationship with spouse or partner: Secondary | ICD-10-CM | POA: Diagnosis not present

## 2023-05-14 DIAGNOSIS — Z630 Problems in relationship with spouse or partner: Secondary | ICD-10-CM | POA: Diagnosis not present

## 2023-05-14 DIAGNOSIS — F4320 Adjustment disorder, unspecified: Secondary | ICD-10-CM | POA: Diagnosis not present

## 2023-05-20 DIAGNOSIS — F4320 Adjustment disorder, unspecified: Secondary | ICD-10-CM | POA: Diagnosis not present

## 2023-05-20 DIAGNOSIS — Z630 Problems in relationship with spouse or partner: Secondary | ICD-10-CM | POA: Diagnosis not present

## 2023-06-03 DIAGNOSIS — F4320 Adjustment disorder, unspecified: Secondary | ICD-10-CM | POA: Diagnosis not present

## 2023-06-03 DIAGNOSIS — Z630 Problems in relationship with spouse or partner: Secondary | ICD-10-CM | POA: Diagnosis not present

## 2023-06-17 DIAGNOSIS — F4320 Adjustment disorder, unspecified: Secondary | ICD-10-CM | POA: Diagnosis not present

## 2023-06-17 DIAGNOSIS — Z630 Problems in relationship with spouse or partner: Secondary | ICD-10-CM | POA: Diagnosis not present

## 2023-07-11 DIAGNOSIS — Z630 Problems in relationship with spouse or partner: Secondary | ICD-10-CM | POA: Diagnosis not present

## 2023-07-11 DIAGNOSIS — F4320 Adjustment disorder, unspecified: Secondary | ICD-10-CM | POA: Diagnosis not present

## 2023-07-29 DIAGNOSIS — F4320 Adjustment disorder, unspecified: Secondary | ICD-10-CM | POA: Diagnosis not present

## 2023-09-11 ENCOUNTER — Ambulatory Visit: Payer: Commercial Managed Care - PPO | Admitting: Dermatology

## 2023-09-26 ENCOUNTER — Ambulatory Visit: Payer: Commercial Managed Care - PPO | Admitting: Dermatology

## 2023-10-09 ENCOUNTER — Ambulatory Visit: Payer: Commercial Managed Care - PPO | Admitting: Dermatology

## 2023-10-10 ENCOUNTER — Ambulatory Visit: Payer: Commercial Managed Care - PPO | Admitting: Dermatology

## 2023-10-10 ENCOUNTER — Encounter: Payer: Self-pay | Admitting: Dermatology

## 2023-10-10 VITALS — BP 117/73

## 2023-10-10 DIAGNOSIS — D239 Other benign neoplasm of skin, unspecified: Secondary | ICD-10-CM

## 2023-10-10 DIAGNOSIS — L578 Other skin changes due to chronic exposure to nonionizing radiation: Secondary | ICD-10-CM

## 2023-10-10 DIAGNOSIS — W908XXA Exposure to other nonionizing radiation, initial encounter: Secondary | ICD-10-CM

## 2023-10-10 DIAGNOSIS — L814 Other melanin hyperpigmentation: Secondary | ICD-10-CM | POA: Diagnosis not present

## 2023-10-10 DIAGNOSIS — L821 Other seborrheic keratosis: Secondary | ICD-10-CM

## 2023-10-10 DIAGNOSIS — S20419A Abrasion of unspecified back wall of thorax, initial encounter: Secondary | ICD-10-CM | POA: Diagnosis not present

## 2023-10-10 DIAGNOSIS — L649 Androgenic alopecia, unspecified: Secondary | ICD-10-CM

## 2023-10-10 DIAGNOSIS — L720 Epidermal cyst: Secondary | ICD-10-CM

## 2023-10-10 DIAGNOSIS — D2371 Other benign neoplasm of skin of right lower limb, including hip: Secondary | ICD-10-CM

## 2023-10-10 DIAGNOSIS — Z85828 Personal history of other malignant neoplasm of skin: Secondary | ICD-10-CM

## 2023-10-10 DIAGNOSIS — L820 Inflamed seborrheic keratosis: Secondary | ICD-10-CM | POA: Diagnosis not present

## 2023-10-10 DIAGNOSIS — L658 Other specified nonscarring hair loss: Secondary | ICD-10-CM

## 2023-10-10 DIAGNOSIS — Z1283 Encounter for screening for malignant neoplasm of skin: Secondary | ICD-10-CM | POA: Diagnosis not present

## 2023-10-10 DIAGNOSIS — D2372 Other benign neoplasm of skin of left lower limb, including hip: Secondary | ICD-10-CM

## 2023-10-10 DIAGNOSIS — D1801 Hemangioma of skin and subcutaneous tissue: Secondary | ICD-10-CM | POA: Diagnosis not present

## 2023-10-10 DIAGNOSIS — T148XXA Other injury of unspecified body region, initial encounter: Secondary | ICD-10-CM

## 2023-10-10 DIAGNOSIS — D229 Melanocytic nevi, unspecified: Secondary | ICD-10-CM

## 2023-10-10 MED ORDER — MINOXIDIL 2.5 MG PO TABS
1.2500 mg | ORAL_TABLET | Freq: Every day | ORAL | 1 refills | Status: AC
Start: 2023-10-10 — End: ?

## 2023-10-10 NOTE — Patient Instructions (Addendum)

## 2023-10-10 NOTE — Progress Notes (Signed)
Follow-Up Visit   Subjective  Sharon Simpson is a 57 y.o. female who presents for the following: Skin Cancer Screening and Full Body Skin Exam - History of BCC of right forehead. No family history of skin cancer.She does wears SPF 50 daily on her face.  She has noticed her hair getting thinner for about 4 years.  The patient presents for Total-Body Skin Exam (TBSE) for skin cancer screening and mole check. The patient has spots, moles and lesions to be evaluated, some may be new or changing and the patient may have concern these could be cancer.  Hx of BCC on right forehead, treated several years ago with biopsy.  The following portions of the chart were reviewed this encounter and updated as appropriate: medications, allergies, medical history  Review of Systems:  No other skin or systemic complaints except as noted in HPI or Assessment and Plan.  Objective  Well appearing patient in no apparent distress; mood and affect are within normal limits.  A full examination was performed including scalp, head, eyes, ears, nose, lips, neck, chest, axillae, abdomen, back, buttocks, bilateral upper extremities, bilateral lower extremities, hands, feet, fingers, toes, fingernails, and toenails. All findings within normal limits unless otherwise noted below.   Relevant physical exam findings are noted in the Assessment and Plan.   Assessment & Plan   SKIN CANCER SCREENING PERFORMED TODAY.  ACTINIC DAMAGE - Chronic condition, secondary to cumulative UV/sun exposure - diffuse scaly erythematous macules with underlying dyspigmentation - Recommend daily broad spectrum sunscreen SPF 30+ to sun-exposed areas, reapply every 2 hours as needed.  - Staying in the shade or wearing long sleeves, sun glasses (UVA+UVB protection) and wide brim hats (4-inch brim around the entire circumference of the hat) are also recommended for sun protection.  - Call for new or changing lesions.  LENTIGINES,  SEBORRHEIC KERATOSES, HEMANGIOMAS - Benign normal skin lesions - Benign-appearing - Call for any changes  MELANOCYTIC NEVI - Tan-brown and/or pink-flesh-colored symmetric macules and papules - Benign appearing on exam today - Observation - Call clinic for new or changing moles - Recommend daily use of broad spectrum spf 30+ sunscreen to sun-exposed areas.   HISTORY OF BASAL CELL CARCINOMA OF THE SKIN - No evidence of recurrence today - Recommend regular full body skin exams - Recommend daily broad spectrum sunscreen SPF 30+ to sun-exposed areas, reapply every 2 hours as needed.  - Call if any new or changing lesions are noted between office visits  Excoriation Exam: Excoriation of upper back  Treatment Plan: RTC if not resolved in the next 2 weeks.  DERMATOFIBROMA Exam: Firm pink/brown papulenodule with dimple sign of left thigh and right lower shin.  Treatment Plan: A dermatofibroma is a benign growth possibly related to trauma, such as an insect bite, cut from shaving, or inflamed acne-type bump.  Treatment options to remove include shave or excision with resulting scar and risk of recurrence.  Since benign-appearing and not bothersome, will observe for now.   ANDROGENETIC ALOPECIA (FEMALE PATTERN HAIR LOSS) Exam: Diffuse thinning of the crown and widening of the midline part with retention of the frontal hairline. Negative hair pull test today.  Female Androgenic Alopecia is a chronic condition related to genetics and/or hormonal changes.  In women androgenetic alopecia is commonly associated with menopause but may occur any time after puberty.  It causes hair thinning primarily on the crown with widening of the part and temporal hairline recession.  Can use OTC Rogaine (minoxidil) 5%  solution/foam as directed.  Oral treatments in female patients who have no contraindication may include : - Low dose oral minoxidil 1.25 - 5mg  daily - Spironolactone- may not be warranted since  patient had hysterectomy and oopherectomy  Treatment Plan: Recommend Rogaine 5% daily (foam or solution) Recommend Minoxidil - no history of heart attack or stroke. Her blood pressure I historically low.  Start Minoxidil 2.5 mg Take 1/2 tablet daily  Long term medication management.  Patient is using long term (months to years) prescription medication  to control their dermatologic condition.  These medications require periodic monitoring to evaluate for efficacy and side effects and may require periodic laboratory monitoring.   EPIDERMAL INCLUSION CYST Exam: Subcutaneous nodule at right neck  Benign-appearing. Exam most consistent with an epidermal inclusion cyst. Discussed that a cyst is a benign growth that can grow over time and sometimes get irritated or inflamed. Recommend observation if it is not bothersome. Discussed option of surgical excision to remove it if it is growing, symptomatic, or other changes noted. Please call for new or changing lesions so they can be evaluated.  Inflamed seborrheic keratosis (7) mid chest x 4, right thigh x 2, right temple x 1  Symptomatic, irritating, patient would like treated.  Benign-appearing.  Call clinic for new or changing lesions.    Destruction of lesion - mid chest x 4, right thigh x 2, right temple x 1 (7) Complexity: simple   Destruction method: cryotherapy   Informed consent: discussed and consent obtained   Timeout:  patient name, date of birth, surgical site, and procedure verified Lesion destroyed using liquid nitrogen: Yes   Region frozen until ice ball extended beyond lesion: Yes   Outcome: patient tolerated procedure well with no complications   Post-procedure details: wound care instructions given     Return in about 6 months (around 04/08/2024) for TBSE.  I, Joanie Coddington, CMA, am acting as scribe for Gwenith Daily, MD .   We spent 45 min reviewing records, taking the patient history, providing face to face care with the  patient, sending prescriptions.   Documentation: I have reviewed the above documentation for accuracy and completeness, and I agree with the above.  Gwenith Daily, MD

## 2023-10-31 ENCOUNTER — Telehealth (HOSPITAL_BASED_OUTPATIENT_CLINIC_OR_DEPARTMENT_OTHER): Payer: Self-pay | Admitting: *Deleted

## 2023-10-31 MED ORDER — VALACYCLOVIR HCL 1 G PO TABS: 1000.0000 mg | ORAL_TABLET | Freq: Every day | ORAL | 1 refills | Status: AC | PRN

## 2023-10-31 NOTE — Telephone Encounter (Signed)
TC from pt requesting refill on Valtrex for fever blisters she gets several times a year.  Refill and advised pt needs annual exam. KWD

## 2023-11-15 ENCOUNTER — Ambulatory Visit (HOSPITAL_BASED_OUTPATIENT_CLINIC_OR_DEPARTMENT_OTHER): Payer: Commercial Managed Care - PPO | Admitting: Family Medicine

## 2023-12-03 ENCOUNTER — Telehealth: Payer: Commercial Managed Care - PPO | Admitting: Physician Assistant

## 2023-12-03 DIAGNOSIS — J019 Acute sinusitis, unspecified: Secondary | ICD-10-CM | POA: Diagnosis not present

## 2023-12-03 DIAGNOSIS — B9689 Other specified bacterial agents as the cause of diseases classified elsewhere: Secondary | ICD-10-CM

## 2023-12-03 MED ORDER — AMOXICILLIN-POT CLAVULANATE 875-125 MG PO TABS: 1.0000 | ORAL_TABLET | Freq: Two times a day (BID) | ORAL | 0 refills | Status: DC

## 2023-12-03 NOTE — Progress Notes (Signed)
I have spent 5 minutes in review of e-visit questionnaire, review and updating patient chart, medical decision making and response to patient.   Mia Milan Cody Jacklynn Dehaas, PA-C    

## 2023-12-03 NOTE — Progress Notes (Signed)

## 2023-12-31 DIAGNOSIS — F4323 Adjustment disorder with mixed anxiety and depressed mood: Secondary | ICD-10-CM | POA: Diagnosis not present

## 2024-01-06 DIAGNOSIS — F4323 Adjustment disorder with mixed anxiety and depressed mood: Secondary | ICD-10-CM | POA: Diagnosis not present

## 2024-01-07 DIAGNOSIS — F4323 Adjustment disorder with mixed anxiety and depressed mood: Secondary | ICD-10-CM | POA: Diagnosis not present

## 2024-01-15 DIAGNOSIS — F4323 Adjustment disorder with mixed anxiety and depressed mood: Secondary | ICD-10-CM | POA: Diagnosis not present

## 2024-01-23 DIAGNOSIS — F4323 Adjustment disorder with mixed anxiety and depressed mood: Secondary | ICD-10-CM | POA: Diagnosis not present

## 2024-01-30 DIAGNOSIS — F4323 Adjustment disorder with mixed anxiety and depressed mood: Secondary | ICD-10-CM | POA: Diagnosis not present

## 2024-02-17 DIAGNOSIS — F4323 Adjustment disorder with mixed anxiety and depressed mood: Secondary | ICD-10-CM | POA: Diagnosis not present

## 2024-03-02 DIAGNOSIS — F4323 Adjustment disorder with mixed anxiety and depressed mood: Secondary | ICD-10-CM | POA: Diagnosis not present

## 2024-03-07 ENCOUNTER — Telehealth: Admitting: Physician Assistant

## 2024-03-07 ENCOUNTER — Encounter: Payer: Self-pay | Admitting: Physician Assistant

## 2024-03-07 DIAGNOSIS — J0110 Acute frontal sinusitis, unspecified: Secondary | ICD-10-CM | POA: Diagnosis not present

## 2024-03-07 MED ORDER — AMOXICILLIN-POT CLAVULANATE 875-125 MG PO TABS: 1.0000 | ORAL_TABLET | Freq: Two times a day (BID) | ORAL | 0 refills | Status: AC

## 2024-03-07 NOTE — Progress Notes (Signed)
E-Visit for Sinus Problems  We are sorry that you are not feeling well.  Here is how we plan to help!  Based on what you have shared with me it looks like you have sinusitis.  Sinusitis is inflammation and infection in the sinus cavities of the head.  Based on your presentation I believe you most likely have Acute Bacterial Sinusitis.  This is an infection caused by bacteria and is treated with antibiotics. I have prescribed Augmentin 875mg/125mg one tablet twice daily with food, for 7 days. You may use an oral decongestant such as Mucinex D or if you have glaucoma or high blood pressure use plain Mucinex. Saline nasal spray help and can safely be used as often as needed for congestion.  If you develop worsening sinus pain, fever or notice severe headache and vision changes, or if symptoms are not better after completion of antibiotic, please schedule an appointment with a health care provider.    Sinus infections are not as easily transmitted as other respiratory infection, however we still recommend that you avoid close contact with loved ones, especially the very young and elderly.  Remember to wash your hands thoroughly throughout the day as this is the number one way to prevent the spread of infection!  Home Care: Only take medications as instructed by your medical team. Complete the entire course of an antibiotic. Do not take these medications with alcohol. A steam or ultrasonic humidifier can help congestion.  You can place a towel over your head and breathe in the steam from hot water coming from a faucet. Avoid close contacts especially the very young and the elderly. Cover your mouth when you cough or sneeze. Always remember to wash your hands.  Get Help Right Away If: You develop worsening fever or sinus pain. You develop a severe head ache or visual changes. Your symptoms persist after you have completed your treatment plan.  Make sure you Understand these instructions. Will watch  your condition. Will get help right away if you are not doing well or get worse.  Thank you for choosing an e-visit.  Your e-visit answers were reviewed by a board certified advanced clinical practitioner to complete your personal care plan. Depending upon the condition, your plan could have included both over the counter or prescription medications.  Please review your pharmacy choice. Make sure the pharmacy is open so you can pick up prescription now. If there is a problem, you may contact your provider through MyChart messaging and have the prescription routed to another pharmacy.  Your safety is important to us. If you have drug allergies check your prescription carefully.   For the next 24 hours you can use MyChart to ask questions about today's visit, request a non-urgent call back, or ask for a work or school excuse. You will get an email in the next two days asking about your experience. I hope that your e-visit has been valuable and will speed your recovery.  I have spent 5 minutes in review of e-visit questionnaire, review and updating patient chart, medical decision making and response to patient.   Korey Prashad S Mayers, PA-C     

## 2024-03-17 DIAGNOSIS — F4323 Adjustment disorder with mixed anxiety and depressed mood: Secondary | ICD-10-CM | POA: Diagnosis not present

## 2024-03-31 DIAGNOSIS — F4323 Adjustment disorder with mixed anxiety and depressed mood: Secondary | ICD-10-CM | POA: Diagnosis not present

## 2024-04-08 ENCOUNTER — Ambulatory Visit: Payer: Commercial Managed Care - PPO | Admitting: Dermatology

## 2024-04-13 ENCOUNTER — Encounter: Payer: Self-pay | Admitting: Dermatology

## 2024-04-13 ENCOUNTER — Ambulatory Visit (INDEPENDENT_AMBULATORY_CARE_PROVIDER_SITE_OTHER): Admitting: Dermatology

## 2024-04-13 VITALS — BP 104/71 | HR 76

## 2024-04-13 DIAGNOSIS — Z1283 Encounter for screening for malignant neoplasm of skin: Secondary | ICD-10-CM | POA: Diagnosis not present

## 2024-04-13 DIAGNOSIS — L720 Epidermal cyst: Secondary | ICD-10-CM

## 2024-04-13 DIAGNOSIS — W908XXA Exposure to other nonionizing radiation, initial encounter: Secondary | ICD-10-CM | POA: Diagnosis not present

## 2024-04-13 DIAGNOSIS — D1801 Hemangioma of skin and subcutaneous tissue: Secondary | ICD-10-CM

## 2024-04-13 DIAGNOSIS — L814 Other melanin hyperpigmentation: Secondary | ICD-10-CM | POA: Diagnosis not present

## 2024-04-13 DIAGNOSIS — D229 Melanocytic nevi, unspecified: Secondary | ICD-10-CM

## 2024-04-13 DIAGNOSIS — L821 Other seborrheic keratosis: Secondary | ICD-10-CM | POA: Diagnosis not present

## 2024-04-13 DIAGNOSIS — D2371 Other benign neoplasm of skin of right lower limb, including hip: Secondary | ICD-10-CM | POA: Diagnosis not present

## 2024-04-13 DIAGNOSIS — L570 Actinic keratosis: Secondary | ICD-10-CM

## 2024-04-13 DIAGNOSIS — L820 Inflamed seborrheic keratosis: Secondary | ICD-10-CM | POA: Diagnosis not present

## 2024-04-13 DIAGNOSIS — Z85828 Personal history of other malignant neoplasm of skin: Secondary | ICD-10-CM

## 2024-04-13 DIAGNOSIS — D239 Other benign neoplasm of skin, unspecified: Secondary | ICD-10-CM

## 2024-04-13 DIAGNOSIS — L578 Other skin changes due to chronic exposure to nonionizing radiation: Secondary | ICD-10-CM

## 2024-04-13 NOTE — Patient Instructions (Signed)

## 2024-04-13 NOTE — Progress Notes (Signed)
 Follow-Up Visit   Subjective  Sharon Simpson is a 58 y.o. female who presents for the following: Skin Cancer Screening and Full Body Skin Exam  The patient presents for Total-Body Skin Exam (TBSE) for skin cancer screening and mole check. The patient has spots, moles and lesions to be evaluated, some may be new or changing.  She is accompanied by her husband.   The following portions of the chart were reviewed this encounter and updated as appropriate: medications, allergies, medical history  Review of Systems:  No other skin or systemic complaints except as noted in HPI or Assessment and Plan.  Objective  Well appearing patient in no apparent distress; mood and affect are within normal limits.  A full examination was performed including scalp, head, eyes, ears, nose, lips, neck, chest, axillae, abdomen, back, buttocks, bilateral upper extremities, bilateral lower extremities, hands, feet, fingers, toes, fingernails, and toenails. All findings within normal limits unless otherwise noted below.   Relevant physical exam findings are noted in the Assessment and Plan.  Chest - Medial [facility]), Left Breast, Left Upper Back, Mid Frontal Scalp, Right Breast, Right Thigh - Anterior Inflamed stuck on papules Right Lower Leg - Anterior Firm subcutaneous nodule with dimple sign Right Ala Nasi Erythematous thin papules/macules with gritty scale.   Assessment & Plan   SKIN CANCER SCREENING PERFORMED TODAY.  ACTINIC DAMAGE - Chronic condition, secondary to cumulative UV/sun exposure - diffuse scaly erythematous macules with underlying dyspigmentation - Recommend daily broad spectrum sunscreen SPF 30+ to sun-exposed areas, reapply every 2 hours as needed.  - Staying in the shade or wearing long sleeves, sun glasses (UVA+UVB protection) and wide brim hats (4-inch brim around the entire circumference of the hat) are also recommended for sun protection.  - Call for new or changing  lesions.  LENTIGINES, SEBORRHEIC KERATOSES, HEMANGIOMAS - Benign normal skin lesions - Benign-appearing - Call for any changes  MELANOCYTIC NEVI - Tan-brown and/or pink-flesh-colored symmetric macules and papules - Benign appearing on exam today - Observation - Call clinic for new or changing moles - Recommend daily use of broad spectrum spf 30+ sunscreen to sun-exposed areas.   HISTORY OF BASAL CELL CARCINOMA OF THE SKIN - No evidence of recurrence today - Recommend regular full body skin exams - Recommend daily broad spectrum sunscreen SPF 30+ to sun-exposed areas, reapply every 2 hours as needed.  - Call if any new or changing lesions are noted between office visits  EPIDERMAL INCLUSION CYST Exam: Subcutaneous nodule at right neck  6mm  Benign-appearing. Exam most consistent with an epidermal inclusion cyst. Discussed that a cyst is a benign growth that can grow over time and sometimes get irritated or inflamed. Recommend observation if it is not bothersome. Discussed option of surgical excision to remove it if it is growing, symptomatic, or other changes noted. Please call for new or changing lesions so they can be evaluated.  ACTINIC KERATOSIS Exam: Erythematous thin papules/macules with gritty scale  Actinic keratoses are precancerous spots that appear secondary to cumulative UV radiation exposure/sun exposure over time. They are chronic with expected duration over 1 year. A portion of actinic keratoses will progress to squamous cell carcinoma of the skin. It is not possible to reliably predict which spots will progress to skin cancer and so treatment is recommended to prevent development of skin cancer.  Recommend daily broad spectrum sunscreen SPF 30+ to sun-exposed areas, reapply every 2 hours as needed.  Recommend staying in the shade or wearing long  sleeves, sun glasses (UVA+UVB protection) and wide brim hats (4-inch brim around the entire circumference of the hat). Call  for new or changing lesions.  Treatment Plan:  Prior to procedure, discussed risks of blister formation, small wound, skin dyspigmentation, or rare scar following cryotherapy. Recommend Vaseline ointment to treated areas while healing.  Destruction Procedure Note Destruction method: cryotherapy   Informed consent: discussed and consent obtained   Lesion destroyed using liquid nitrogen: Yes   Outcome: patient tolerated procedure well with no complications   Post-procedure details: wound care instructions given   Locations: right nasal ala # of Lesions Treated: 1  INFLAMED SEBORRHEIC KERATOSIS (6) Chest - Medial (Center), Left Breast, Left Upper Back, Mid Frontal Scalp, Right Breast, Right Thigh - Anterior Destruction of lesion - Chest - Medial (Center), Left Breast, Left Upper Back, Mid Frontal Scalp, Right Breast Complexity: simple   Destruction method: cryotherapy   Informed consent: discussed and consent obtained   Timeout:  patient name, date of birth, surgical site, and procedure verified Lesion destroyed using liquid nitrogen: Yes   Region frozen until ice ball extended beyond lesion: Yes   Cryotherapy cycles:  2 Outcome: patient tolerated procedure well with no complications   Post-procedure details: wound care instructions given   DERMATOFIBROMA Right Lower Leg - Anterior Observation.  Call the office if you notice any changes AK (ACTINIC KERATOSIS) Right Ala Nasi Destruction of lesion - Right Ala Nasi Complexity: simple   Destruction method: cryotherapy   Informed consent: discussed and consent obtained   Timeout:  patient name, date of birth, surgical site, and procedure verified Lesion destroyed using liquid nitrogen: Yes   Region frozen until ice ball extended beyond lesion: Yes   Cryotherapy cycles:  2 Outcome: patient tolerated procedure well with no complications   Post-procedure details: wound care instructions given   EIC (EPIDERMAL INCLUSION  CYST)   HISTORY OF BASAL CELL CANCER   MULTIPLE BENIGN NEVI   LENTIGINES   SEBORRHEIC KERATOSES   CHERRY ANGIOMA   ACTINIC SKIN DAMAGE   Return in about 6 months (around 10/14/2024) for TBSC.  I, Haig Levan, Surg Tech III, am acting as scribe for Deneise Finlay, MD.   Documentation: I have reviewed the above documentation for accuracy and completeness, and I agree with the above.  Deneise Finlay, MD

## 2024-04-14 ENCOUNTER — Encounter: Payer: Self-pay | Admitting: Dermatology

## 2024-04-14 ENCOUNTER — Ambulatory Visit: Admitting: Dermatology

## 2024-04-14 DIAGNOSIS — F4323 Adjustment disorder with mixed anxiety and depressed mood: Secondary | ICD-10-CM | POA: Diagnosis not present

## 2024-04-14 NOTE — Progress Notes (Deleted)
   Follow-Up Visit   Subjective  Sharon Simpson is a 58 y.o. female who presents for the following: Excision of right neck  The following portions of the chart were reviewed this encounter and updated as appropriate: medications, allergies, medical history  Review of Systems:  No other skin or systemic complaints except as noted in HPI or Assessment and Plan.  Objective  Well appearing patient in no apparent distress; mood and affect are within normal limits.  A focused examination was performed of the following areas: Right neck Relevant physical exam findings are noted in the Assessment and Plan.     Assessment & Plan      No follow-ups on file.  ***  Documentation: I have reviewed the above documentation for accuracy and completeness, and I agree with the above.  Deneise Finlay, MD

## 2024-04-14 NOTE — Patient Instructions (Addendum)
 Sharon Simpson

## 2024-04-30 ENCOUNTER — Encounter: Payer: Self-pay | Admitting: Dermatology

## 2024-05-06 ENCOUNTER — Ambulatory Visit: Admitting: Dermatology

## 2024-05-07 DIAGNOSIS — F4323 Adjustment disorder with mixed anxiety and depressed mood: Secondary | ICD-10-CM | POA: Diagnosis not present

## 2024-05-19 DIAGNOSIS — F4323 Adjustment disorder with mixed anxiety and depressed mood: Secondary | ICD-10-CM | POA: Diagnosis not present

## 2024-06-02 DIAGNOSIS — F4323 Adjustment disorder with mixed anxiety and depressed mood: Secondary | ICD-10-CM | POA: Diagnosis not present

## 2024-07-13 ENCOUNTER — Other Ambulatory Visit (HOSPITAL_BASED_OUTPATIENT_CLINIC_OR_DEPARTMENT_OTHER): Payer: Self-pay

## 2024-07-13 ENCOUNTER — Ambulatory Visit (HOSPITAL_BASED_OUTPATIENT_CLINIC_OR_DEPARTMENT_OTHER)
Admission: RE | Admit: 2024-07-13 | Discharge: 2024-07-13 | Disposition: A | Discharge status: Home / Self Care | Source: Ambulatory Visit | Attending: Obstetrics & Gynecology | Admitting: Obstetrics & Gynecology

## 2024-07-13 ENCOUNTER — Encounter (HOSPITAL_BASED_OUTPATIENT_CLINIC_OR_DEPARTMENT_OTHER): Payer: Self-pay | Admitting: Radiology

## 2024-07-13 ENCOUNTER — Other Ambulatory Visit (HOSPITAL_BASED_OUTPATIENT_CLINIC_OR_DEPARTMENT_OTHER): Payer: Self-pay | Admitting: Obstetrics & Gynecology

## 2024-07-13 DIAGNOSIS — Z1231 Encounter for screening mammogram for malignant neoplasm of breast: Secondary | ICD-10-CM

## 2024-07-13 MED ORDER — SHINGRIX 50 MCG/0.5ML IM SUSR
0.5000 mL | Freq: Once | INTRAMUSCULAR | 0 refills | Status: AC
  Filled 2024-07-13: qty 0.5, 1d supply, fill #0

## 2024-10-02 DIAGNOSIS — B002 Herpesviral gingivostomatitis and pharyngotonsillitis: Secondary | ICD-10-CM | POA: Diagnosis not present

## 2024-10-02 DIAGNOSIS — H00012 Hordeolum externum right lower eyelid: Secondary | ICD-10-CM | POA: Diagnosis not present
